# Patient Record
Sex: Female | Born: 1955 | Race: White | Hispanic: No | Marital: Single | Smoking: Never smoker
Health system: Southern US, Community
[De-identification: ages and names within clinical notes are randomized; demographics above are authoritative.]

## PROBLEM LIST (undated history)

## (undated) DIAGNOSIS — E785 Hyperlipidemia, unspecified: Secondary | ICD-10-CM

## (undated) DIAGNOSIS — R399 Unspecified symptoms and signs involving the genitourinary system: Secondary | ICD-10-CM

## (undated) DIAGNOSIS — Z8669 Personal history of other diseases of the nervous system and sense organs: Secondary | ICD-10-CM

## (undated) DIAGNOSIS — M8580 Other specified disorders of bone density and structure, unspecified site: Secondary | ICD-10-CM

## (undated) DIAGNOSIS — H3530 Unspecified macular degeneration: Secondary | ICD-10-CM

## (undated) HISTORY — DX: Unspecified macular degeneration: H35.30

## (undated) HISTORY — DX: Other specified disorders of bone density and structure, unspecified site: M85.80

## (undated) HISTORY — DX: Unspecified symptoms and signs involving the genitourinary system: R39.9

## (undated) HISTORY — DX: Hyperlipidemia, unspecified: E78.5

## (undated) HISTORY — PX: BREAST ENHANCEMENT SURGERY: SHX7

## (undated) HISTORY — DX: Personal history of other diseases of the nervous system and sense organs: Z86.69

---

## 1998-03-21 ENCOUNTER — Encounter: Payer: Self-pay | Admitting: Internal Medicine

## 1999-07-21 ENCOUNTER — Other Ambulatory Visit: Admission: RE | Admit: 1999-07-21 | Discharge: 1999-07-21 | Payer: Self-pay | Admitting: Internal Medicine

## 2000-10-28 ENCOUNTER — Other Ambulatory Visit: Admission: RE | Admit: 2000-10-28 | Discharge: 2000-10-28 | Payer: Self-pay | Admitting: Internal Medicine

## 2002-09-12 ENCOUNTER — Ambulatory Visit (HOSPITAL_COMMUNITY): Admission: RE | Admit: 2002-09-12 | Discharge: 2002-09-12 | Payer: Self-pay | Admitting: Internal Medicine

## 2002-09-12 ENCOUNTER — Encounter: Payer: Self-pay | Admitting: Internal Medicine

## 2002-11-13 ENCOUNTER — Other Ambulatory Visit: Admission: RE | Admit: 2002-11-13 | Discharge: 2002-11-13 | Payer: Self-pay | Admitting: Internal Medicine

## 2002-11-21 HISTORY — PX: OTHER SURGICAL HISTORY: SHX169

## 2002-12-04 ENCOUNTER — Encounter: Admission: RE | Admit: 2002-12-04 | Discharge: 2002-12-04 | Payer: Self-pay | Admitting: Internal Medicine

## 2002-12-04 ENCOUNTER — Encounter: Payer: Self-pay | Admitting: Internal Medicine

## 2004-02-21 ENCOUNTER — Encounter: Payer: Self-pay | Admitting: Internal Medicine

## 2004-02-21 LAB — CONVERTED CEMR LAB

## 2004-03-19 ENCOUNTER — Other Ambulatory Visit: Admission: RE | Admit: 2004-03-19 | Discharge: 2004-03-19 | Payer: Self-pay | Admitting: Internal Medicine

## 2004-12-10 ENCOUNTER — Ambulatory Visit: Payer: Self-pay | Admitting: Internal Medicine

## 2005-04-06 ENCOUNTER — Ambulatory Visit: Payer: Self-pay | Admitting: Internal Medicine

## 2006-02-23 ENCOUNTER — Ambulatory Visit: Payer: Self-pay | Admitting: Internal Medicine

## 2006-02-23 ENCOUNTER — Other Ambulatory Visit: Admission: RE | Admit: 2006-02-23 | Discharge: 2006-02-23 | Payer: Self-pay | Admitting: Internal Medicine

## 2006-03-17 ENCOUNTER — Ambulatory Visit: Payer: Self-pay | Admitting: Internal Medicine

## 2006-03-18 ENCOUNTER — Encounter: Admission: RE | Admit: 2006-03-18 | Discharge: 2006-03-18 | Payer: Self-pay | Admitting: Internal Medicine

## 2006-04-14 ENCOUNTER — Ambulatory Visit: Payer: Self-pay | Admitting: Internal Medicine

## 2007-02-17 ENCOUNTER — Encounter: Payer: Self-pay | Admitting: Internal Medicine

## 2007-02-17 DIAGNOSIS — M949 Disorder of cartilage, unspecified: Secondary | ICD-10-CM

## 2007-02-17 DIAGNOSIS — M899 Disorder of bone, unspecified: Secondary | ICD-10-CM | POA: Insufficient documentation

## 2007-03-02 ENCOUNTER — Telehealth: Payer: Self-pay | Admitting: Internal Medicine

## 2007-03-08 ENCOUNTER — Encounter: Payer: Self-pay | Admitting: Internal Medicine

## 2007-03-14 ENCOUNTER — Ambulatory Visit: Payer: Self-pay | Admitting: Gastroenterology

## 2007-03-16 ENCOUNTER — Ambulatory Visit: Payer: Self-pay | Admitting: Internal Medicine

## 2007-03-16 ENCOUNTER — Encounter: Payer: Self-pay | Admitting: Internal Medicine

## 2007-03-24 ENCOUNTER — Ambulatory Visit: Payer: Self-pay | Admitting: Gastroenterology

## 2007-03-24 ENCOUNTER — Encounter: Payer: Self-pay | Admitting: Internal Medicine

## 2007-04-01 ENCOUNTER — Ambulatory Visit: Payer: Self-pay | Admitting: Internal Medicine

## 2007-04-01 LAB — CONVERTED CEMR LAB
Bilirubin Urine: NEGATIVE
Blood in Urine, dipstick: NEGATIVE
Glucose, Urine, Semiquant: NEGATIVE
Ketones, urine, test strip: NEGATIVE
Nitrite: NEGATIVE
Protein, U semiquant: NEGATIVE
Specific Gravity, Urine: 1.015
Urobilinogen, UA: 0.2
pH: 6

## 2007-04-03 LAB — CONVERTED CEMR LAB
ALT: 14 units/L (ref 0–35)
AST: 20 units/L (ref 0–37)
Albumin: 4.1 g/dL (ref 3.5–5.2)
Alkaline Phosphatase: 60 units/L (ref 39–117)
BUN: 23 mg/dL (ref 6–23)
Basophils Absolute: 0 10*3/uL (ref 0.0–0.1)
Basophils Relative: 0.4 % (ref 0.0–1.0)
Bilirubin, Direct: 0.1 mg/dL (ref 0.0–0.3)
CO2: 31 meq/L (ref 19–32)
Calcium: 9.4 mg/dL (ref 8.4–10.5)
Chloride: 107 meq/L (ref 96–112)
Cholesterol: 235 mg/dL (ref 0–200)
Creatinine, Ser: 0.8 mg/dL (ref 0.4–1.2)
Direct LDL: 144.4 mg/dL
Eosinophils Absolute: 0.2 10*3/uL (ref 0.0–0.6)
Eosinophils Relative: 4.4 % (ref 0.0–5.0)
GFR calc Af Amer: 96 mL/min
GFR calc non Af Amer: 80 mL/min
Glucose, Bld: 80 mg/dL (ref 70–99)
HCT: 36.6 % (ref 36.0–46.0)
HDL: 73.2 mg/dL (ref >39.0)
Hemoglobin: 12.7 g/dL (ref 12.0–15.0)
Lymphocytes Relative: 42.2 % (ref 12.0–46.0)
MCHC: 34.7 g/dL (ref 30.0–36.0)
MCV: 92.4 fL (ref 78.0–100.0)
Monocytes Absolute: 0.5 10*3/uL (ref 0.2–0.7)
Monocytes Relative: 10.5 % (ref 3.0–11.0)
Neutro Abs: 1.9 10*3/uL (ref 1.4–7.7)
Neutrophils Relative %: 42.5 % — ABNORMAL LOW (ref 43.0–77.0)
Platelets: 213 10*3/uL (ref 150–400)
Potassium: 4.4 meq/L (ref 3.5–5.1)
RBC: 3.96 M/uL (ref 3.87–5.11)
RDW: 12 % (ref 11.5–14.6)
Sodium: 142 meq/L (ref 135–145)
TSH: 1.57 microintl units/mL (ref 0.35–5.50)
Total Bilirubin: 0.6 mg/dL (ref 0.3–1.2)
Total CHOL/HDL Ratio: 3.2
Total Protein: 6.4 g/dL (ref 6.0–8.3)
Triglycerides: 43 mg/dL (ref 0–149)
VLDL: 9 mg/dL (ref 0–40)
WBC: 4.4 10*3/uL — ABNORMAL LOW (ref 4.5–10.5)

## 2007-04-11 ENCOUNTER — Other Ambulatory Visit: Admission: RE | Admit: 2007-04-11 | Discharge: 2007-04-11 | Payer: Self-pay | Admitting: Internal Medicine

## 2007-04-11 ENCOUNTER — Ambulatory Visit: Payer: Self-pay | Admitting: Internal Medicine

## 2007-04-11 ENCOUNTER — Encounter: Payer: Self-pay | Admitting: Internal Medicine

## 2007-04-11 DIAGNOSIS — Z8739 Personal history of other diseases of the musculoskeletal system and connective tissue: Secondary | ICD-10-CM | POA: Insufficient documentation

## 2007-04-11 DIAGNOSIS — H3530 Unspecified macular degeneration: Secondary | ICD-10-CM | POA: Insufficient documentation

## 2007-04-11 LAB — HM COLONOSCOPY: HM Colonoscopy: NORMAL

## 2007-04-11 LAB — HM MAMMOGRAPHY

## 2007-12-26 ENCOUNTER — Telehealth: Payer: Self-pay | Admitting: *Deleted

## 2008-02-13 ENCOUNTER — Telehealth: Payer: Self-pay | Admitting: Internal Medicine

## 2008-03-12 ENCOUNTER — Encounter: Payer: Self-pay | Admitting: Internal Medicine

## 2008-03-19 ENCOUNTER — Ambulatory Visit: Payer: Self-pay | Admitting: Internal Medicine

## 2008-03-19 DIAGNOSIS — G479 Sleep disorder, unspecified: Secondary | ICD-10-CM | POA: Insufficient documentation

## 2008-03-19 DIAGNOSIS — M79609 Pain in unspecified limb: Secondary | ICD-10-CM | POA: Insufficient documentation

## 2008-03-19 DIAGNOSIS — M25519 Pain in unspecified shoulder: Secondary | ICD-10-CM | POA: Insufficient documentation

## 2008-03-19 DIAGNOSIS — H1045 Other chronic allergic conjunctivitis: Secondary | ICD-10-CM | POA: Insufficient documentation

## 2008-03-19 DIAGNOSIS — E785 Hyperlipidemia, unspecified: Secondary | ICD-10-CM | POA: Insufficient documentation

## 2008-03-23 ENCOUNTER — Telehealth (INDEPENDENT_AMBULATORY_CARE_PROVIDER_SITE_OTHER): Payer: Self-pay | Admitting: *Deleted

## 2008-03-27 ENCOUNTER — Encounter: Payer: Self-pay | Admitting: Internal Medicine

## 2008-04-02 ENCOUNTER — Telehealth: Payer: Self-pay | Admitting: *Deleted

## 2008-04-04 ENCOUNTER — Encounter: Payer: Self-pay | Admitting: Internal Medicine

## 2008-04-11 ENCOUNTER — Ambulatory Visit: Payer: Self-pay | Admitting: Internal Medicine

## 2008-04-11 LAB — CONVERTED CEMR LAB
ALT: 15 units/L (ref 0–35)
AST: 16 units/L (ref 0–37)
Albumin: 4.1 g/dL (ref 3.5–5.2)
Alkaline Phosphatase: 72 units/L (ref 39–117)
BUN: 15 mg/dL (ref 6–23)
Basophils Absolute: 0 10*3/uL (ref 0.0–0.1)
Basophils Relative: 0.2 % (ref 0.0–3.0)
Bilirubin Urine: NEGATIVE
Bilirubin, Direct: 0.1 mg/dL (ref 0.0–0.3)
Blood in Urine, dipstick: NEGATIVE
CO2: 34 meq/L — ABNORMAL HIGH (ref 19–32)
Calcium: 9.5 mg/dL (ref 8.4–10.5)
Chloride: 101 meq/L (ref 96–112)
Cholesterol: 247 mg/dL (ref 0–200)
Creatinine, Ser: 0.8 mg/dL (ref 0.4–1.2)
Direct LDL: 125.9 mg/dL
Eosinophils Absolute: 0.1 10*3/uL (ref 0.0–0.7)
Eosinophils Relative: 2.8 % (ref 0.0–5.0)
GFR calc Af Amer: 96 mL/min
GFR calc non Af Amer: 79 mL/min
Glucose, Bld: 75 mg/dL (ref 70–99)
Glucose, Urine, Semiquant: NEGATIVE
HCT: 38.6 % (ref 36.0–46.0)
HDL: 77.6 mg/dL (ref >39.0)
Hemoglobin: 13.3 g/dL (ref 12.0–15.0)
Ketones, urine, test strip: NEGATIVE
Lymphocytes Relative: 50 % — ABNORMAL HIGH (ref 12.0–46.0)
MCHC: 34.5 g/dL (ref 30.0–36.0)
MCV: 94.6 fL (ref 78.0–100.0)
Monocytes Absolute: 0.6 10*3/uL (ref 0.1–1.0)
Monocytes Relative: 12 % (ref 3.0–12.0)
Neutro Abs: 1.8 10*3/uL (ref 1.4–7.7)
Neutrophils Relative %: 35 % — ABNORMAL LOW (ref 43.0–77.0)
Nitrite: NEGATIVE
Platelets: 194 10*3/uL (ref 150–400)
Potassium: 3.9 meq/L (ref 3.5–5.1)
Protein, U semiquant: NEGATIVE
RBC: 4.08 M/uL (ref 3.87–5.11)
RDW: 12.8 % (ref 11.5–14.6)
Sodium: 143 meq/L (ref 135–145)
Specific Gravity, Urine: 1.02
TSH: 3.26 microintl units/mL (ref 0.35–5.50)
Total Bilirubin: 0.8 mg/dL (ref 0.3–1.2)
Total CHOL/HDL Ratio: 3.2
Total Protein: 6.9 g/dL (ref 6.0–8.3)
Triglycerides: 80 mg/dL (ref 0–149)
Urobilinogen, UA: 0.2
VLDL: 16 mg/dL (ref 0–40)
WBC: 5.1 10*3/uL (ref 4.5–10.5)
pH: 5.5

## 2008-04-27 ENCOUNTER — Ambulatory Visit: Payer: Self-pay | Admitting: Internal Medicine

## 2008-04-27 DIAGNOSIS — L608 Other nail disorders: Secondary | ICD-10-CM | POA: Insufficient documentation

## 2008-08-25 IMAGING — CR DG CHEST 2V
2 series · 2 of 2 positions shown · non-contrast
Comparison: none

CLINICAL DATA: Cough.  Chest pain. 
 CHEST ? 2 VIEW:

[view not recorded (1 of 2)]
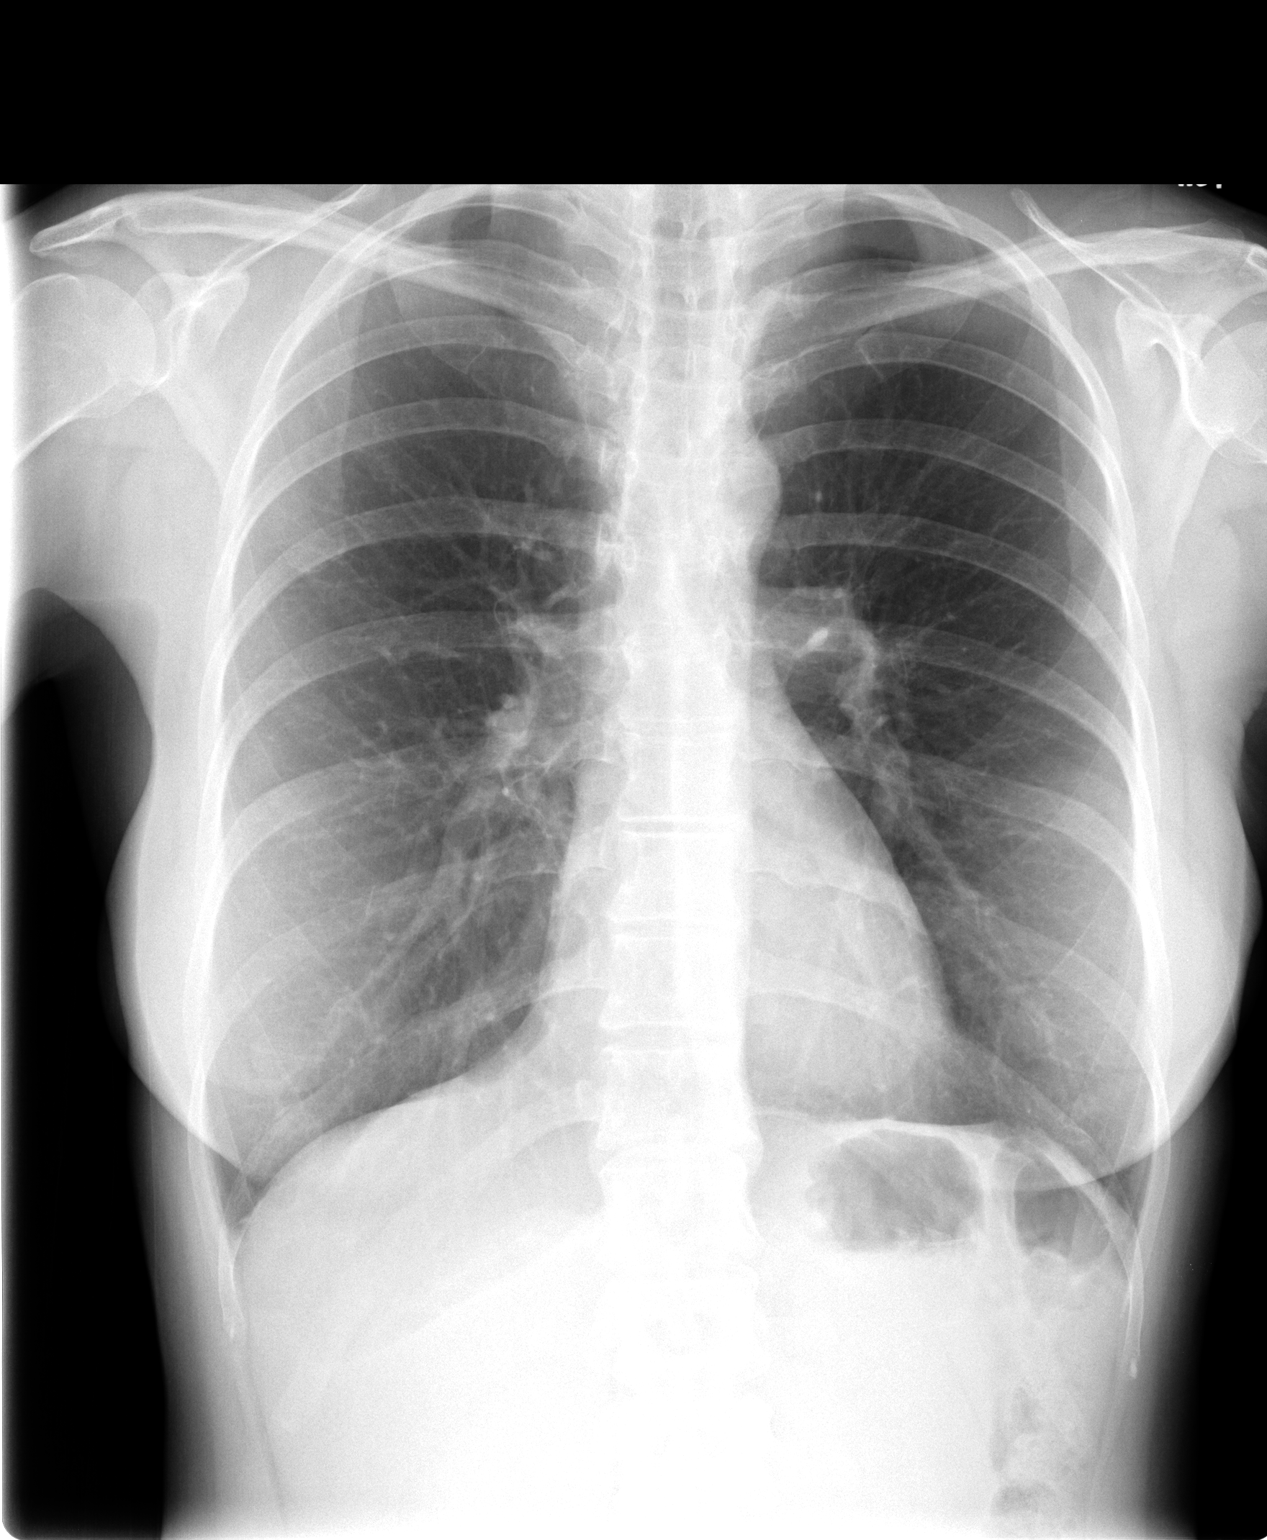

[view not recorded (2 of 2)]
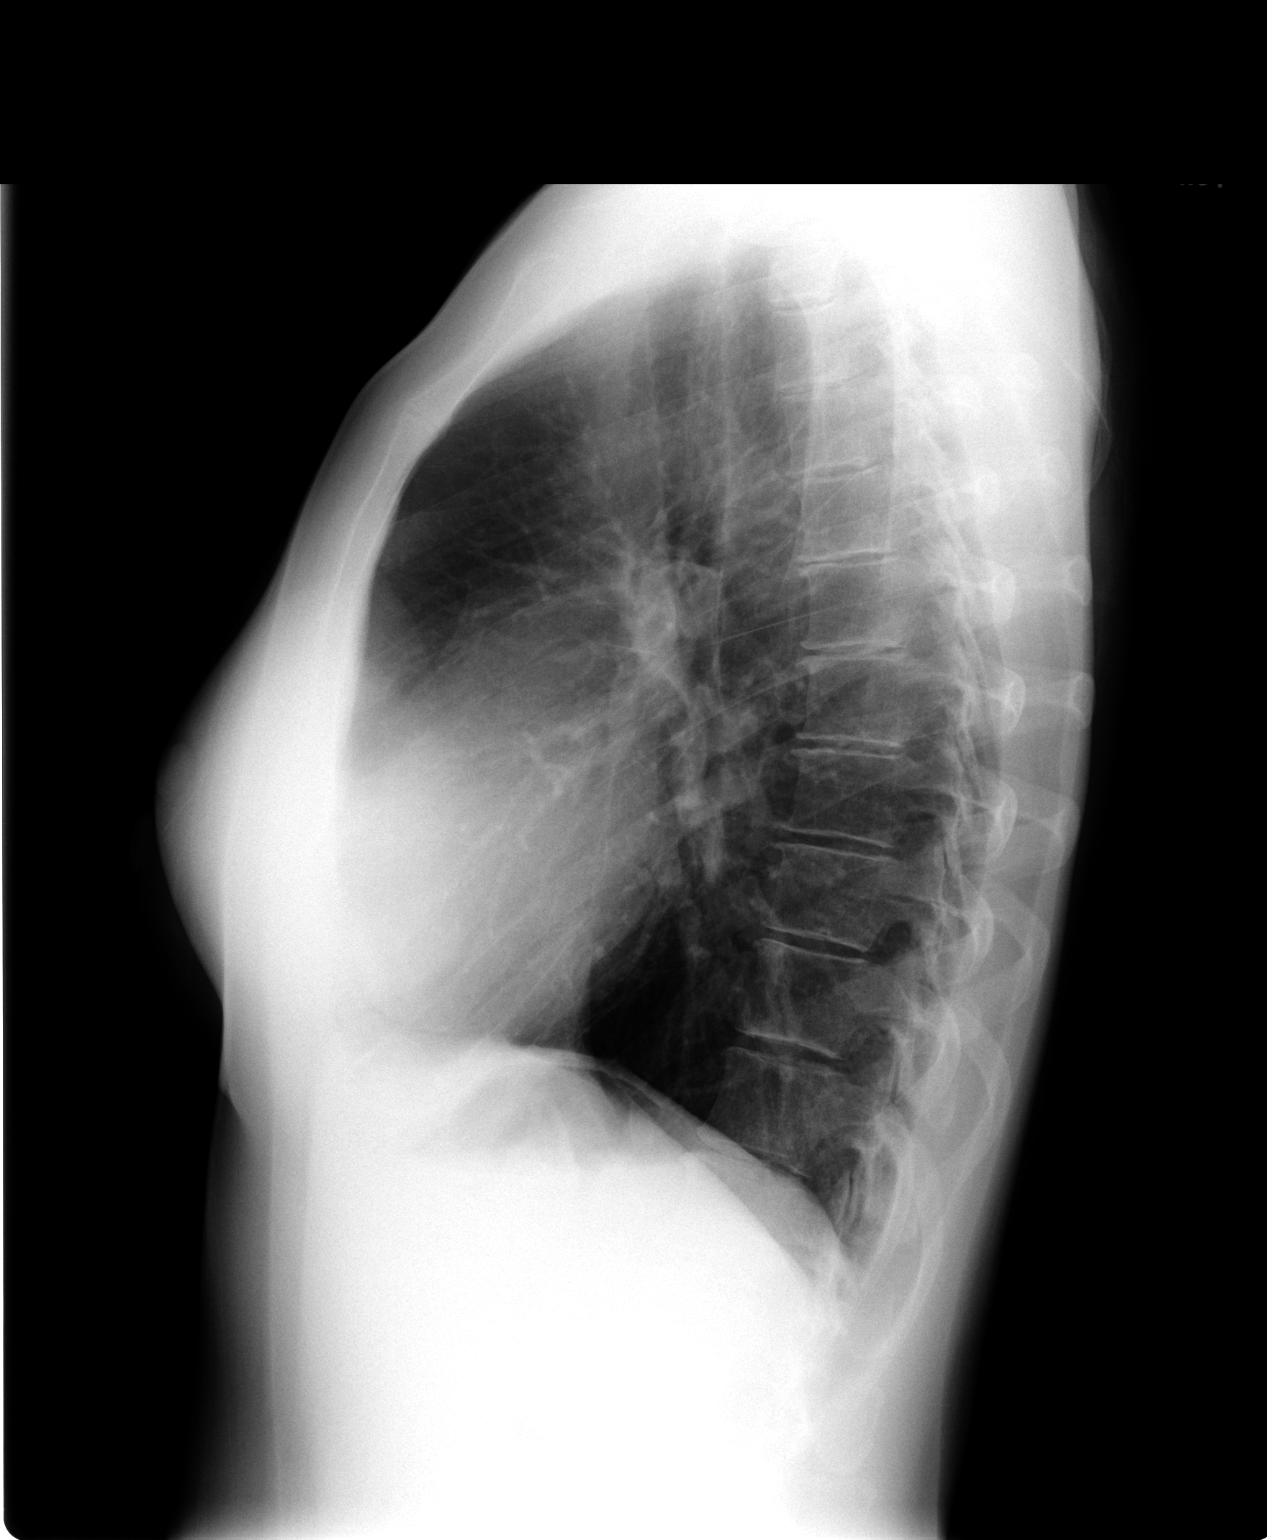

[2 of 2 positions shown; findings below may reference images not displayed]

FINDINGS: Two views of the chest show the lungs to be clear.  The heart is within normal limits in size.  No bony abnormality is seen.
IMPRESSION: No active lung disease.

## 2008-11-15 ENCOUNTER — Ambulatory Visit: Payer: Self-pay | Admitting: Internal Medicine

## 2008-11-15 DIAGNOSIS — J019 Acute sinusitis, unspecified: Secondary | ICD-10-CM | POA: Insufficient documentation

## 2008-11-15 DIAGNOSIS — J309 Allergic rhinitis, unspecified: Secondary | ICD-10-CM | POA: Insufficient documentation

## 2009-03-25 ENCOUNTER — Encounter: Payer: Self-pay | Admitting: Internal Medicine

## 2009-03-26 ENCOUNTER — Telehealth: Payer: Self-pay | Admitting: *Deleted

## 2009-03-27 ENCOUNTER — Ambulatory Visit: Payer: Self-pay | Admitting: Internal Medicine

## 2009-04-29 ENCOUNTER — Encounter: Payer: Self-pay | Admitting: Internal Medicine

## 2009-04-29 ENCOUNTER — Ambulatory Visit: Payer: Self-pay | Admitting: Internal Medicine

## 2009-05-01 ENCOUNTER — Ambulatory Visit: Payer: Self-pay | Admitting: Internal Medicine

## 2009-05-01 LAB — CONVERTED CEMR LAB
ALT: 15 units/L (ref 0–35)
AST: 21 units/L (ref 0–37)
Alkaline Phosphatase: 67 units/L (ref 39–117)
Basophils Relative: 0.7 % (ref 0.0–3.0)
Bilirubin, Direct: 0 mg/dL (ref 0.0–0.3)
Chloride: 104 meq/L (ref 96–112)
Cholesterol: 212 mg/dL — ABNORMAL HIGH (ref 0–200)
Creatinine, Ser: 0.7 mg/dL (ref 0.4–1.2)
Eosinophils Relative: 2.6 % (ref 0.0–5.0)
Lymphocytes Relative: 41.6 % (ref 12.0–46.0)
MCV: 94.6 fL (ref 78.0–100.0)
Monocytes Absolute: 0.4 10*3/uL (ref 0.1–1.0)
Monocytes Relative: 11.1 % (ref 3.0–12.0)
Neutrophils Relative %: 44 % (ref 43.0–77.0)
Protein, U semiquant: NEGATIVE
RBC: 3.82 M/uL — ABNORMAL LOW (ref 3.87–5.11)
Total CHOL/HDL Ratio: 3
Total Protein: 6.8 g/dL (ref 6.0–8.3)
Urobilinogen, UA: 0.2
VLDL: 11.2 mg/dL (ref 0.0–40.0)
WBC: 3.6 10*3/uL — ABNORMAL LOW (ref 4.5–10.5)

## 2009-05-08 ENCOUNTER — Other Ambulatory Visit: Admission: RE | Admit: 2009-05-08 | Discharge: 2009-05-08 | Payer: Self-pay | Admitting: Internal Medicine

## 2009-05-08 ENCOUNTER — Ambulatory Visit: Payer: Self-pay | Admitting: Internal Medicine

## 2009-06-04 ENCOUNTER — Telehealth: Payer: Self-pay | Admitting: Internal Medicine

## 2009-09-06 ENCOUNTER — Telehealth: Payer: Self-pay | Admitting: *Deleted

## 2010-03-04 ENCOUNTER — Ambulatory Visit: Payer: Self-pay | Admitting: Internal Medicine

## 2010-03-27 ENCOUNTER — Encounter: Payer: Self-pay | Admitting: Internal Medicine

## 2010-03-31 ENCOUNTER — Encounter: Payer: Self-pay | Admitting: Internal Medicine

## 2010-04-01 ENCOUNTER — Encounter: Payer: Self-pay | Admitting: Internal Medicine

## 2010-04-03 ENCOUNTER — Telehealth: Payer: Self-pay | Admitting: Internal Medicine

## 2010-04-07 ENCOUNTER — Telehealth: Payer: Self-pay | Admitting: Internal Medicine

## 2010-05-20 ENCOUNTER — Ambulatory Visit: Payer: Self-pay | Admitting: Internal Medicine

## 2010-05-20 LAB — CONVERTED CEMR LAB
AST: 21 units/L (ref 0–37)
Albumin: 4.2 g/dL (ref 3.5–5.2)
Alkaline Phosphatase: 69 units/L (ref 39–117)
Basophils Absolute: 0 10*3/uL (ref 0.0–0.1)
Basophils Relative: 0.5 % (ref 0.0–3.0)
Bilirubin, Direct: 0.1 mg/dL (ref 0.0–0.3)
Calcium: 9.4 mg/dL (ref 8.4–10.5)
Creatinine, Ser: 0.8 mg/dL (ref 0.4–1.2)
GFR calc non Af Amer: 84.86 mL/min (ref >60)
HDL: 82 mg/dL (ref >39.00)
Hemoglobin: 12.8 g/dL (ref 12.0–15.0)
Ketones, urine, test strip: NEGATIVE
Lymphocytes Relative: 37.2 % (ref 12.0–46.0)
Monocytes Relative: 10.6 % (ref 3.0–12.0)
Neutro Abs: 2.2 10*3/uL (ref 1.4–7.7)
Neutrophils Relative %: 49.1 % (ref 43.0–77.0)
Nitrite: NEGATIVE
Protein, U semiquant: NEGATIVE
RBC: 3.93 M/uL (ref 3.87–5.11)
RDW: 13.9 % (ref 11.5–14.6)
Sodium: 141 meq/L (ref 135–145)
Total CHOL/HDL Ratio: 3
Triglycerides: 43 mg/dL (ref 0.0–149.0)
Urobilinogen, UA: 0.2

## 2010-05-27 ENCOUNTER — Ambulatory Visit: Payer: Self-pay | Admitting: Internal Medicine

## 2010-07-22 NOTE — Progress Notes (Signed)
Summary: Paula Salas please call  Phone Note Call from Patient Call back at Home Phone [phone]   Caller: Patient Call For: [name] MD Summary of Call: pt would like to speak with Paula Salas today. Initial call taken by: Heron Sabins,  April 03, 2010 9:12 AM  Follow-up for Phone Call        LM for pt to call back. Follow-up by: Romualdo Bolk, CMA Duncan Dull),  April 03, 2010 9:20 AM  Additional Follow-up for Phone Call Additional follow up Details #1::        Spoke to pt- Pt is going on a trip next week and wants a flu shot.  She keeps having coughing in her chest, sinus drainage. Pt is taking sudafed during the day. Pt states that she is worried about her inj's that she Dr.Rankin and she is afraid that this maybe a side effect.  I told pt to call Dr. Luciana Axe about this. Continue the sudafed or delsym.  Pt to call Dr. Ephriam Knuckles office to see if this could be a side effect of the medication and to see if she can get a flu shot because her next inj is next week which is when she is wanting to get the flu shot. Pt to call us back if she starts running a fever or gets worse. She will also let us know about what Dr. Luciana Axe said about the flu shot as well. Additional Follow-up by: Romualdo Bolk, CMA (AAMA),  April 03, 2010 9:30 AM    Additional Follow-up for Phone Call Additional follow up Details #2::    can get a flu shot any time not having a fever illness Follow-up by: [name] MD,  April 03, 2010 12:56 PM   Appended Document: Paula Salas please call Pt aware of this.

## 2010-07-22 NOTE — Assessment & Plan Note (Signed)
Summary: cpx/njr   Vital Signs:  Patient profile:   55 year old female Menstrual status:  postmenopausal Height:      62.25 inches Weight:      136 pounds Pulse rate:   66 / minute BP sitting:   100 / 60  (left arm) Cuff size:   regular  Vitals Entered By: Romualdo Bolk, CMA (AAMA) (May 27, 2010 11:21 AM) CC: CPX discuss doing a pap   History of Present Illness: Paula Salas comes in today  for preventive visit . Since her last visit she is doing well No major changes in health status.   Eye injections weekly and stable MS  Back and hip stable and walks well.  No injury . UTD on mammogram . traveling back anc forth [place]    Preventive Care Screening  Mammogram:    Date:  03/22/2010    Results:  normal   Prior Values:    Pap Smear:  NEGATIVE FOR INTRAEPITHELIAL LESIONS OR MALIGNANCY. (05/08/2009)    Mammogram:  normal (03/25/2009)    Colonoscopy:  normal (04/11/2007)    Last Tetanus Booster:  Tdap (05/08/2009)   Preventive Screening-Counseling & Management  Alcohol-Tobacco     Alcohol drinks/day: 0     Smoking Status: never     Passive Smoke Exposure: no  Caffeine-Diet-Exercise     Caffeine use/day: 2     Does Patient Exercise: yes  Hep-HIV-STD-Contraception     Dental Visit-last 6 months yes     Sun Exposure-Excessive: no  Safety-Violence-Falls     Seat Belt Use: yes     Firearms in the Home: no firearms in the home     Smoke Detectors: yes     Fall Risk: non  Current Medications (verified): 1)  Calcium-Vitamin D 600-200 Mg-Unit  Tabs (Calcium-Vitamin D) .... Once Daily 2)  Zovirax 5 %  Crea (Acyclovir) .... Use As Directed 3)  Nasonex 50 Mcg/act Susp (Mometasone Furoate) .... 2 Spray Each Nares To Control Allergies 4)  Icaps  Caps (Multiple Vitamins-Minerals) 5)  Magnesium Oxide 250 Mg Tabs (Magnesium Oxide) 6)  Vitamin B Complex-C   Caps (B Complex-C) 7)  Valtrex 1 Gm Tabs (Valacyclovir Hcl) .... 2 By Mouth Two Times A Day 8)   Systane Preservative Free 0.4-0.3 % Soln (Polyethyl Glycol-Propyl Glycol)  Allergies (verified): No Known Drug Allergies  Past History:  Past medical, surgical, family and social histories (including risk factors) reviewed, and no changes noted (except as noted below).  Past Medical History: Reviewed history from 05/08/2009 and no changes required. Osteopenia dexa 98 -1.4 and,-1.1, 99 -1.77 and -1.9, 2010 -1.9 -1.6 on bisphosphonate from 12 2003 to 4/07 Macular degeneration  hx of retinal hemorrhage Hyperlipidemia         LAST Mammogram: 2009 Pap: 2008 Td: 2000 Colonscopy:2001 EKG: 2004 Dexa: 2009 Allergic rhinitis  Past Surgical History: Reviewed history from 02/17/2007 and no changes required. breast implants colonoscopy 2007 MRI hip negative 06/04  Past History:  Care Management: Orthopedics: Tomasita Crumble Ophthalmology: [facility]in   Family History: Reviewed history from 11/15/2008 and no changes required. Family History of Colon CA 1st degree relative <60 Osteoporosis      Social History: Reviewed history from 03/04/2010 and no changes required. Single Never Smoked Alcohol use-no back to [facility]  for the winter   BF in [place] [facility] of [facility] no pets.      Fall Risk:  non  Review of Systems  12 sytem review neg cv pulm  GI GU .  decrease vision with MAc degeneration getting her shots .  feels well.  gets sinsu infection with weather changes   Physical Exam  General:  Well-developed,well-nourished,in no acute distress; alert,appropriate and cooperative throughout examination Head:  Normocephalic and atraumatic without obvious abnormalities. No apparent alopecia or balding. Eyes:  vision grossly intact.   Ears:  R ear normal and L ear normal.   Nose:  no external deformity, no external erythema, and no nasal discharge.   Mouth:  good dentition and pharynx pink and moist.   Neck:  No deformities, masses, or tenderness noted. Breasts:  No mass, nodules,  thickening, tenderness, bulging, retraction, inflamation, nipple discharge or skin changes noted.   breast  implants  Lungs:  Normal respiratory effort, chest expands symmetrically. Lungs are clear to auscultation, no crackles or wheezes.no dullness.   Heart:  Normal rate and regular rhythm. S1 and S2 normal without gallop, murmur, click, rub or other extra sounds.no lifts.   Abdomen:  Bowel sounds positive,abdomen soft and non-tender without masses, organomegaly or hernias noted. Genitalia:  no indecated  Msk:  no joint warmth, no redness over joints, and no joint deformities.   Pulses:  pulses intact without delay   Extremities:  no clubbing cyanosis or edema  Neurologic:  alert & oriented X3, cranial nerves IIi-XII intact, strength normal in all extremities, and gait normal.   Skin:  turgor normal, color normal, no ecchymoses, no petechiae, and no purpura.   Cervical Nodes:  No lymphadenopathy noted Axillary Nodes:  No palpable lymphadenopathy Inguinal Nodes:  No significant adenopathy Psych:  Normal eye contact, appropriate affect. Cognition appears normal.    Impression & Recommendations:  Problem # 1:  Preventive Health Care (ICD-V70.0) contniue health lifestyle   flu shot today  Problem # 2:  HYPERLIPIDEMIA (ICD-272.4)  good ratio   follow could get ldl down  Labs Reviewed: SGOT: 21 (05/20/2010)   SGPT: 15 (05/20/2010)   HDL:82.00 (05/20/2010), 63.10 (05/01/2009)  LDL:DEL (04/11/2008), DEL (04/01/2007)  Chol:259 (05/20/2010), 212 (05/01/2009)  Trig:43.0 (05/20/2010), 56.0 (05/01/2009)  Problem # 3:  OSTEOPENIA (ICD-733.90) continue  last dexa 2010  Her updated medication list for this problem includes:    Calcium-vitamin D 600-200 Mg-unit Tabs (Calcium-vitamin d) ..... 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  Complete Medication List: 1)  Calcium-vitamin D 600-200 Mg-unit Tabs (Calcium-vitamin d) .... Once daily 2)  Zovirax 5 % Crea (Acyclovir) .... Use as directed 3)  Nasonex 50 Mcg/act Susp (Mometasone furoate) .... 2 spray each nares to control allergies 4)  Icaps Caps (Multiple vitamins-minerals) 5)  Magnesium Oxide 250 Mg Tabs (Magnesium oxide) 6)  Vitamin B Complex-c Caps (B complex-c) 7)  Valtrex 1 Gm Tabs (Valacyclovir hcl) .... 2 by mouth two times a day 8)  Systane Preservative Free 0.4-0.3 % Soln (Polyethyl glycol-propyl glycol)  Other Orders: Admin 1st Vaccine ([address]) Flu Vaccine 52yrs + (60454)  Patient Instructions: 1)  continue heart healthy diet .  2)  exercise . 3)  Check up in a year or as needed. 4)  Can do PAPs every 2-3 years    Orders Added: 1)  Admin 1st Vaccine [90471] 2)  Flu Vaccine 40yrs + [90658] 3)  Est. Patient 40-64 years [99396]    Preventive Care  Screening  Mammogram:    Date:  03/22/2010    Results:  normal   Prior Values:    Pap Smear:  NEGATIVE FOR INTRAEPITHELIAL LESIONS OR MALIGNANCY. (05/08/2009)    Mammogram:  normal (03/25/2009)    Colonoscopy:  normal (04/11/2007)    Last Tetanus Booster:  Tdap (05/08/2009) Flu Vaccine Consent Questions     Do you have a history of severe allergic reactions to this vaccine? no    Any prior history of allergic reactions to egg and/or gelatin? no    Do you have a sensitivity to the preservative Thimersol? no    Do you have a past history of Guillan-Barre Syndrome? no    Do you currently have an acute febrile illness? no    Have you ever had a severe reaction to latex? no    Vaccine information given and explained to patient? yes    Are you currently pregnant? no    Lot Number:[id number]   Exp Date:12/20/2010   Site Given  Left Deltoid IM Romualdo Bolk, CMA (AAMA)  May 27, 2010 12:20 PM     Mammogram:  normal (03/25/2009)    Colonoscopy:  normal (04/11/2007)    Last  Tetanus Booster:  Tdap (05/08/2009)        .lbflu

## 2010-07-22 NOTE — Assessment & Plan Note (Signed)
Summary: sinus/dm   Vital Signs:  Patient profile:   55 year old female Menstrual status:  postmenopausal Height:      62.25 inches Weight:      134 pounds BMI:     24.40 Temp:     98.8 degrees F oral Pulse rate:   84 / minute BP sitting:   120 / 80  (right arm) Cuff size:   regular  Vitals Entered By: Romualdo Bolk, CMA (AAMA) (March 04, 2010 9:41 AM) CC: Ha, sinus congestion, gum pain x 1 week ago. Pt states that it has now gone down into her chest with chills at night, eyes watery and nasal congestion. Pt has taken zyrtec but it made her nose dry and took allerga but couldn't sleep at night.    History of Present Illness: Paula Salas comes in today  for above    worse  after a week and worried about bronchitis . also.   Had teeth pain at onset and now some better but ongoing .  NO fever .  chills at night and  achy  .  had tried zyrtec and dired her out. ? if allergy or not  . Trying   Mucinex d  nasal decongestant      and netti pot.    bad congestion  at night and used nose spray.  No c sob NVD    Preventive Screening-Counseling & Management  Alcohol-Tobacco     Alcohol drinks/day: 0     Smoking Status: never     Passive Smoke Exposure: no  Caffeine-Diet-Exercise     Caffeine use/day: 2     Does Patient Exercise: yes  Current Medications (verified): 1)  Calcium-Vitamin D 600-200 Mg-Unit  Tabs (Calcium-Vitamin D) .... Once Daily 2)  Multiple Vitamin   Tabs (Multiple Vitamin) 3)  Zovirax 5 %  Crea (Acyclovir) .... Use As Directed 4)  Trazodone Hcl 50 Mg Tabs (Trazodone Hcl) .... 1/2 To 1 By Mouth Hs As Needed Sleep 5)  Nasonex 50 Mcg/act Susp (Mometasone Furoate) .... 2 Spray Each Nares To Control Allergies 6)  Icaps  Caps (Multiple Vitamins-Minerals) 7)  Magnesium Oxide 250 Mg Tabs (Magnesium Oxide) 8)  Vitamin B Complex-C   Caps (B Complex-C) 9)  Valtrex 1 Gm Tabs (Valacyclovir Hcl) .... 2 By Mouth Two Times A Day 10)  Systane Preservative Free  0.4-0.3 % Soln (Polyethyl Glycol-Propyl Glycol)  Allergies (verified): No Known Drug Allergies  Past History:  Past medical, surgical, family and social histories (including risk factors) reviewed, and no changes noted (except as noted below).  Past Medical History: Reviewed history from 05/08/2009 and no changes required. Osteopenia dexa 98 -1.4 and,-1.1, 99 -1.77 and -1.9, 2010 -1.9 -1.6 on bisphosphonate from 12 2003 to 4/07 Macular degeneration  hx of retinal hemorrhage Hyperlipidemia         LAST Mammogram: 2009 Pap: 2008 Td: 2000 Colonscopy:2001 EKG: 2004 Dexa: 2009 Allergic rhinitis  Past Surgical History: Reviewed history from 02/17/2007 and no changes required. breast implants colonoscopy 2007 MRI hip negative 06/04  Past History:  Care Management: Orthopedics: Tomasita Crumble Ophthalmology: Rankin   Family History: Reviewed history from 11/15/2008 and no changes required. Family History of Colon CA 1st degree relative <60 Osteoporosis      Social History: Reviewed history from 05/08/2009 and no changes required. Single Never Smoked Alcohol use-no back to [facility]  for the winter  [facility] of 1 no pets.       Review of Systems  The patient complains of anorexia and headaches.  The patient denies decreased hearing, hoarseness, hemoptysis, abdominal pain, muscle weakness, abnormal bleeding, enlarged lymph nodes, and angioedema.         chills at night and sweats   Physical Exam  General:  Well-developed,well-nourished,in no acute distress; alert,appropriate and cooperative throughout examination  congested and tired appearign Head:  normocephalic and atraumatic.   Eyes:  PERRL, EOMs full, conjunctiva clear  Ears:  R ear normal, L ear normal, and no external deformities.   Nose:  congested     bilaterally no swelling or tenderness Mouth:  pharynx pink and moist.   no lesions Neck:  No deformities, masses, or tenderness noted. Lungs:  Normal  respiratory effort, chest expands symmetrically. Lungs are clear to auscultation, no crackles or wheezes.no dullness.   / ocass musical sound  Heart:  Normal rate and regular rhythm. S1 and S2 normal without gallop, murmur, click, rub or other extra sounds.no lifts.   Pulses:  nl cap refill  Neurologic:  non focal  Skin:  turgor normal and color normal.   Cervical Nodes:  No lymphadenopathy noted Psych:  Oriented X3, good eye contact, and not anxious appearing.     Impression & Recommendations:  Problem # 1:  SINUSITIS - ACUTE-NOS (ICD-461.9)  ? fever   although no long standing severity of signs  and cough   will empirically treat  . disc diff dx with pt  Her updated medication list for this problem includes:    Nasonex 50 Mcg/act Susp (Mometasone furoate) .Marland Kitchen... 2 spray each nares to control allergies    Azithromycin 250 Mg Tabs (Azithromycin) .Marland Kitchen... Take 2   day 1 and then 1 by mouth once daily  Instructed on treatment. Call if symptoms persist or worsen.   Problem # 2:  ALLERGIC RHINITIS (ICD-477.9)  Her updated medication list for this problem includes:    Nasonex 50 Mcg/act Susp (Mometasone furoate) .Marland Kitchen... 2 spray each nares to control allergies  Complete Medication List: 1)  Calcium-vitamin D 600-200 Mg-unit Tabs (Calcium-vitamin d) .... Once daily 2)  Multiple Vitamin Tabs (Multiple vitamin) 3)  Zovirax 5 % Crea (Acyclovir) .... Use as directed 4)  Trazodone Hcl 50 Mg Tabs (Trazodone hcl) .... 1/2 to 1 by mouth hs as needed sleep 5)  Nasonex 50 Mcg/act Susp (Mometasone furoate) .... 2 spray each nares to control allergies 6)  Icaps Caps (Multiple vitamins-minerals) 7)  Magnesium Oxide 250 Mg Tabs (Magnesium oxide) 8)  Vitamin B Complex-c Caps (B complex-c) 9)  Valtrex 1 Gm Tabs (Valacyclovir hcl) .... 2 by mouth two times a day 10)  Systane Preservative Free 0.4-0.3 % Soln (Polyethyl glycol-propyl glycol) 11)  Azithromycin 250 Mg Tabs (Azithromycin) .... Take 2   day 1 and  then 1 by mouth once daily  Patient Instructions: 1)  if have  a fever over 2-3 days.  then call  2)  continue treating the allergy.  try plain allegra    3)  decongestants can keep you awake.   4)  This seems like infection possibly on top of allergy.  Prescriptions: AZITHROMYCIN 250 MG TABS (AZITHROMYCIN) take 2   day 1 and then 1 by mouth once daily  #6 x 0   Entered and Authorized by:   [name] MD   Signed by:   [name] MD on 03/04/2010   Method used:   Electronically to        [facility] [phone]* (retail)  [address]       [address]       [place], [place]  [address]       Ph: [phone]       Fax: [phone]   RxID:   [id number]

## 2010-07-22 NOTE — Progress Notes (Signed)
Summary: referral to [facility] for bone density  Phone Note Call from Patient Call back at [facility] Phone [phone]   Caller: vm Reason for Call: Talk to Nurse Summary of Call: Got a letter saying to check with doctor to see if bone density is apporpriate for her.  Patient thinks last one was May 2 yr ago.  Needs referal, [facility].  [phone] Initial call taken by: Rudy Jew, RN,  April 07, 2010 12:21 PM  Follow-up for Phone Call        her last bone density  DEXA was with [place]  in [facility] 2010 . NOt due until november 2012 .  Follow-up by: [name] MD,  April 07, 2010 5:22 PM  Additional Follow-up for Phone Call Additional follow up Details #1::        returning a call. Additional Follow-up by: Warnell Forester,  April 08, 2010 11:08 AM    Additional Follow-up for Phone Call Additional follow up Details #2::    Pt Notified Follow-up by: Trixie Dredge,  April 08, 2010 2:38 PM

## 2010-07-22 NOTE — Progress Notes (Signed)
Summary: valtrex  Phone Note Call from Patient   Caller: Patient Call For: [name] MD Summary of Call: Pt is having cold sores and would like Valtrex sent to [facility] in [place].  Did not leave any phone numbers.  Will check the chart. Initial call taken by: Lynann Beaver CMA,  September 06, 2009 9:19 AM  Follow-up for Phone Call        valtrex 1000mg  take 2 by mouth two times a day as needed .  Disp: 30 refill x 3  Follow-up by: [name] MD,  September 06, 2009 11:09 AM  Additional Follow-up for Phone Call Additional follow up Details #1::        Left message for pt to call back. We need the number to call in the rx or have the pharmacy contact us. Additional Follow-up by: Romualdo Bolk, CMA Duncan Dull),  September 09, 2009 1:08 PM    Additional Follow-up for Phone Call Additional follow up Details #2::    Pt wants rx called into [place], [place] [phone] rx called in. Follow-up by: Romualdo Bolk, CMA Duncan Dull),  September 11, 2009 4:50 PM  New/Updated Medications: VALTREX 1 GM TABS (VALACYCLOVIR HCL) 2 by mouth two times a day Prescriptions: VALTREX 1 GM TABS (VALACYCLOVIR HCL) 2 by mouth two times a day  #30 x 3   Entered by:   Romualdo Bolk, CMA (AAMA)   Authorized by:   [name] MD   Signed by:   Romualdo Bolk, CMA (AAMA) on 09/11/2009   Method used:   Telephoned to ...       [facility] [phone]* (retail)       [address]       [address]       [place], [place]  [address]       Ph: [phone]       Fax: [phone]   RxID:   [id number]

## 2011-03-06 ENCOUNTER — Telehealth: Payer: Self-pay | Admitting: Internal Medicine

## 2011-03-06 NOTE — Telephone Encounter (Signed)
Pt called to scheduled cpx i  Informed pt what next available was in February. Pt is requesting to be worked in sooner because she only lives part time in [place] and will be leaving for [place] on October 25th and will not be back for 6 months. Please advise

## 2011-03-10 NOTE — Telephone Encounter (Signed)
Can use 10/23 at 8:15 and block 8:30

## 2011-03-13 NOTE — Telephone Encounter (Signed)
Pt called back to check on getting a cpx sch with Dr Fabian Sharp. Pt has been sch for cpx on 04/14/11 at 8:15am, as noted below. Pt has labs sch for 03/24/11.

## 2011-03-20 ENCOUNTER — Encounter: Payer: Self-pay | Admitting: Internal Medicine

## 2011-03-20 DIAGNOSIS — M8580 Other specified disorders of bone density and structure, unspecified site: Secondary | ICD-10-CM | POA: Insufficient documentation

## 2011-03-20 DIAGNOSIS — Z8669 Personal history of other diseases of the nervous system and sense organs: Secondary | ICD-10-CM | POA: Insufficient documentation

## 2011-03-24 ENCOUNTER — Other Ambulatory Visit (INDEPENDENT_AMBULATORY_CARE_PROVIDER_SITE_OTHER): Payer: PRIVATE HEALTH INSURANCE

## 2011-03-24 DIAGNOSIS — Z0000 Encounter for general adult medical examination without abnormal findings: Secondary | ICD-10-CM

## 2011-03-24 LAB — POCT URINALYSIS DIPSTICK
Bilirubin, UA: NEGATIVE
Blood, UA: NEGATIVE
Glucose, UA: NEGATIVE
Nitrite, UA: POSITIVE
Spec Grav, UA: 1.02
Urobilinogen, UA: 0.2
pH, UA: 6

## 2011-03-24 LAB — HEPATIC FUNCTION PANEL
ALT: 32 U/L (ref 0–35)
AST: 33 U/L (ref 0–37)
Albumin: 4.6 g/dL (ref 3.5–5.2)
Alkaline Phosphatase: 51 U/L (ref 39–117)

## 2011-03-24 LAB — CBC WITH DIFFERENTIAL/PLATELET
Eosinophils Absolute: 0 10*3/uL (ref 0.0–0.7)
HCT: 38.3 % (ref 36.0–46.0)
Lymphs Abs: 1.1 10*3/uL (ref 0.7–4.0)
MCHC: 33.3 g/dL (ref 30.0–36.0)
MCV: 93.9 fl (ref 78.0–100.0)
Monocytes Absolute: 0.5 10*3/uL (ref 0.1–1.0)
Neutrophils Relative %: 70.6 % (ref 43.0–77.0)
Platelets: 215 10*3/uL (ref 150.0–400.0)
RDW: 13.5 % (ref 11.5–14.6)

## 2011-03-24 LAB — TSH: TSH: 0.59 u[IU]/mL (ref 0.35–5.50)

## 2011-03-24 LAB — BASIC METABOLIC PANEL
Calcium: 9.2 mg/dL (ref 8.4–10.5)
GFR: 84.6 mL/min (ref >60.00)
Sodium: 141 mEq/L (ref 135–145)

## 2011-03-24 LAB — LIPID PANEL
Cholesterol: 222 mg/dL — ABNORMAL HIGH (ref 0–200)
Triglycerides: 38 mg/dL (ref 0.0–149.0)

## 2011-04-06 ENCOUNTER — Encounter: Payer: Self-pay | Admitting: Internal Medicine

## 2011-04-14 ENCOUNTER — Encounter: Payer: Self-pay | Admitting: Internal Medicine

## 2011-04-14 ENCOUNTER — Ambulatory Visit (INDEPENDENT_AMBULATORY_CARE_PROVIDER_SITE_OTHER): Payer: PRIVATE HEALTH INSURANCE | Admitting: Internal Medicine

## 2011-04-14 VITALS — BP 110/74 | HR 73 | Temp 98.9°F | Ht 62.0 in | Wt 131.0 lb

## 2011-04-14 DIAGNOSIS — M899 Disorder of bone, unspecified: Secondary | ICD-10-CM

## 2011-04-14 DIAGNOSIS — Z23 Encounter for immunization: Secondary | ICD-10-CM

## 2011-04-14 DIAGNOSIS — Z0000 Encounter for general adult medical examination without abnormal findings: Secondary | ICD-10-CM | POA: Insufficient documentation

## 2011-04-14 DIAGNOSIS — H3530 Unspecified macular degeneration: Secondary | ICD-10-CM

## 2011-04-14 DIAGNOSIS — E785 Hyperlipidemia, unspecified: Secondary | ICD-10-CM

## 2011-04-14 DIAGNOSIS — N390 Urinary tract infection, site not specified: Secondary | ICD-10-CM | POA: Insufficient documentation

## 2011-04-14 DIAGNOSIS — J309 Allergic rhinitis, unspecified: Secondary | ICD-10-CM

## 2011-04-14 DIAGNOSIS — M8580 Other specified disorders of bone density and structure, unspecified site: Secondary | ICD-10-CM

## 2011-04-14 MED ORDER — SULFAMETHOXAZOLE-TRIMETHOPRIM 800-160 MG PO TABS: 1.0000 | ORAL_TABLET | Freq: Two times a day (BID) | ORAL | Status: AC

## 2011-04-14 NOTE — Patient Instructions (Signed)
Take  Antibiotic for Urinary infection. Continue lifestyle intervention healthy eating and exercise . Recheck with wellness visit in a year . PAP smear for next year.

## 2011-04-14 NOTE — Progress Notes (Signed)
Subjective:    Patient ID: Paula Salas, female    DOB: [birth year], 55 y.o.   MRN: [id number]  HPI Patient comes in today for preventive visit and follow-up of medical issues. Update of her history since her last visit.  Allergic  On going but no sinus infection npow Macular degeneration getting shots in left eye no change in vision status Osteopenia no fractures exercising and ca vit d MS  Joint ms discomfort stable and exercise helps.  Review of Systems ROS:  GEN/ HEENTNo fever, significant weight changes sweats headaches vision problems hearing changes, CV/ PULM; No chest pain shortness of breath cough, syncope,edema  change in exercise tolerance. GI /GU: No adominal pain, vomiting, change in bowel habits. No blood in the stool. No significant GU symptoms. But some odor and frequency  Trying cranberry pills SKIN/HEME: ,no acute skin rashes suspicious lesions or bleeding. No lymphadenopathy, nodules, masses.  NEURO/ PSYCH:  No neurologic signs such as weakness numbness No depression anxiety. IMM/ Allergy: No unusual infections.  Allergy .   REST of 12 system review negative  Past history family history social history reviewed in the electronic medical record.     Objective:   Physical Exam Physical Exam: Vital signs reviewed JXB:JYNW is a well-developed well-nourished alert cooperative  white female who appears her stated age in no acute distress.  HEENT: normocephalic  traumatic , Eyes: PERRL EOM's full, conjunctiva clear, Nares: paten,t no deformity discharge or tenderness., Ears: no deformity EAC's clear TMs with normal landmarks. Mouth: clear OP, no lesions, edema.  Moist mucous membranes. Dentition in adequate repair. NECK: supple without masses, thyromegaly or bruits. CHEST/PULM:  Clear to auscultation and percussion breath sounds equal no wheeze , rales or rhonchi. No chest wall deformities or tenderness. Breast: normal by inspection . No dimpling, discharge, masses,  tenderness or discharge . Implants LN: no cervical axillary inguinal adenopathy CV: PMI is nondisplaced, S1 S2 no gallops, murmurs, rubs. Peripheral pulses are full without delay.No JVD .  ABDOMEN: Bowel sounds normal nontender  No guard or rebound, no hepato splenomegal no CVA tenderness.  No hernia. Extremtities:  No clubbing cyanosis or edema, no acute joint swelling or redness no focal atrophy NEURO:  Oriented x3, cranial nerves 3-12 appear to be intact, no obvious focal weakness,gait within normal limits no abnormal reflexes or asymmetrical SKIN: No acute rashes normal turgor, color, no bruising or petechiae. PSYCH: Oriented, good eye contact, no obvious depression anxiety, cognition and judgment appear normal.  Lab Results  Component Value Date   WBC 5.5 03/24/2011   HGB 12.7 03/24/2011   HCT 38.3 03/24/2011   PLT 215.0 03/24/2011   GLUCOSE 91 03/24/2011   CHOL 222* 03/24/2011   TRIG 38.0 03/24/2011   HDL 79.80 03/24/2011   LDLDIRECT 136.8 03/24/2011   ALT 32 03/24/2011   AST 33 03/24/2011   NA 141 03/24/2011   K 3.7 03/24/2011   CL 107 03/24/2011   CREATININE 0.8 03/24/2011   BUN 18 03/24/2011   CO2 25 03/24/2011   TSH 0.59 03/24/2011   UA pos nitrites and trc leuk and trc bl.     Assessment & Plan:  Preventive Health Care Counseled regarding healthy nutrition, exercise, sleep, injury prevention, calcium vit d and healthy weight .  UTI  Hx of same  Mild sx but definite  empiric rx a  Going out of town to [place] for winter tomorrow Osteopenia stable continue exercise Macular degeneration stable right eye  Allergic sinsu sx.  Using  mucinex antihistamines needed.  Nocturnal drainage cough at times no alarm features  LIPIDS better with   lsi  Check up in a year

## 2011-04-18 ENCOUNTER — Encounter: Payer: Self-pay | Admitting: Internal Medicine

## 2011-07-15 ENCOUNTER — Encounter: Payer: Self-pay | Admitting: Family

## 2011-07-15 ENCOUNTER — Ambulatory Visit (INDEPENDENT_AMBULATORY_CARE_PROVIDER_SITE_OTHER): Payer: PRIVATE HEALTH INSURANCE | Admitting: Family

## 2011-07-15 VITALS — BP 102/70 | Temp 98.4°F | Wt 130.0 lb

## 2011-07-15 DIAGNOSIS — R300 Dysuria: Secondary | ICD-10-CM

## 2011-07-15 DIAGNOSIS — N390 Urinary tract infection, site not specified: Secondary | ICD-10-CM

## 2011-07-15 LAB — POCT URINALYSIS DIPSTICK
Bilirubin, UA: NEGATIVE
Ketones, UA: NEGATIVE
Leukocytes, UA: NEGATIVE
Protein, UA: NEGATIVE
Spec Grav, UA: 1.015

## 2011-07-15 NOTE — Progress Notes (Signed)
  Subjective:    Patient ID: Paula Salas, female    DOB: May 02, 1956, 56 y.o.   MRN: [id number]  HPI 56 year old [place] female patient of Dr. Fabian Sharp presents today for recheck of urinary tract infection. She's been on Macrobid for one month. Had a urine culture that was positive for Escherichia coli. She originally presented with a foul-smelling urine but no other urinary symptoms. Those symptoms have since resolved since been on Macrobid. She denies any urinary frequency, urgency, blood in the urine, foul-smelling urine, back pain or abdominal pain.   Review of Systems  Respiratory: Negative.   Cardiovascular: Positive for palpitations.  Gastrointestinal: Negative.   Genitourinary: Negative.        Urinary odor has resolved  Skin: Negative.   Hematological: Negative.        Objective:   Physical Exam  Constitutional: She is oriented to person, place, and time. She appears well-developed and well-nourished.  Cardiovascular: Normal rate, regular rhythm and normal heart sounds.   Pulmonary/Chest: Effort normal and breath sounds normal.  Abdominal: Soft. Bowel sounds are normal.  Musculoskeletal: Normal range of motion.  Neurological: She is alert and oriented to person, place, and time.  Skin: Skin is warm and dry.  Psychiatric: She has a normal mood and affect.          Assessment & Plan:  Assessment urinary tract infection resolved  Plan: urine culture sent to be sure that her urine is completely clear. No patient in a result provider copy of the lab so that she may since her doctor in [place]. Recheck sooner when necessary.

## 2011-07-31 ENCOUNTER — Telehealth: Payer: Self-pay | Admitting: Internal Medicine

## 2011-07-31 NOTE — Telephone Encounter (Signed)
Pt aware of this. 

## 2011-07-31 NOTE — Telephone Encounter (Signed)
The culture wasn't collected per [facility]. Per [facility] Span- she did bring it to the lab. I left a message for pt to call back.

## 2011-07-31 NOTE — Telephone Encounter (Signed)
Pt called req to get results from urine culture done on 07/15/11. Pt also req to get a copy mailed to her in [place]. [address] [place], [place] [address].  Pls call pt asap.

## 2012-03-21 ENCOUNTER — Encounter: Payer: Self-pay | Admitting: Gastroenterology

## 2012-03-31 LAB — HM PAP SMEAR: HM Pap smear: NORMAL

## 2012-11-22 ENCOUNTER — Encounter: Payer: Self-pay | Admitting: Internal Medicine

## 2012-12-14 ENCOUNTER — Encounter: Payer: Self-pay | Admitting: Gastroenterology

## 2013-01-08 ENCOUNTER — Encounter: Payer: Self-pay | Admitting: Family Medicine

## 2013-01-08 ENCOUNTER — Ambulatory Visit (INDEPENDENT_AMBULATORY_CARE_PROVIDER_SITE_OTHER): Payer: BC Managed Care – PPO | Admitting: Family Medicine

## 2013-01-08 VITALS — BP 138/69 | HR 80 | Temp 98.0°F | Resp 16 | Ht 62.0 in | Wt 126.0 lb

## 2013-01-08 DIAGNOSIS — R12 Heartburn: Secondary | ICD-10-CM

## 2013-01-08 DIAGNOSIS — L500 Allergic urticaria: Secondary | ICD-10-CM

## 2013-01-08 DIAGNOSIS — R509 Fever, unspecified: Secondary | ICD-10-CM

## 2013-01-08 DIAGNOSIS — M273 Alveolitis of jaws: Secondary | ICD-10-CM

## 2013-01-08 DIAGNOSIS — Z8719 Personal history of other diseases of the digestive system: Secondary | ICD-10-CM

## 2013-01-08 DIAGNOSIS — K0510 Chronic gingivitis, plaque induced: Secondary | ICD-10-CM

## 2013-01-08 DIAGNOSIS — R197 Diarrhea, unspecified: Secondary | ICD-10-CM

## 2013-01-08 LAB — POCT CBC
Granulocyte percent: 64.6 %G (ref 37–80)
HCT, POC: 39.2 % (ref 37.7–47.9)
Hemoglobin: 12.4 g/dL (ref 12.2–16.2)
Lymph, poc: 2.2 (ref 0.6–3.4)
MCH, POC: 30.2 pg (ref 27–31.2)
MCHC: 31.6 g/dL — AB (ref 31.8–35.4)
MCV: 95.3 fL (ref 80–97)
MID (cbc): 0.5 (ref 0–0.9)
MPV: 9.5 fL (ref 0–99.8)
POC Granulocyte: 4.8 (ref 2–6.9)
POC LYMPH PERCENT: 29.1 %L (ref 10–50)
POC MID %: 6.3 %M (ref 0–12)
Platelet Count, POC: 223 10*3/uL (ref 142–424)
RBC: 4.11 M/uL (ref 4.04–5.48)
RDW, POC: 13.5 %
WBC: 7.4 10*3/uL (ref 4.6–10.2)

## 2013-01-08 MED ORDER — CHLORHEXIDINE GLUCONATE 0.12 % MT SOLN: 15.0000 mL | Freq: Two times a day (BID) | OROMUCOSAL | Status: DC

## 2013-01-08 MED ORDER — PREDNISONE 20 MG PO TABS: ORAL_TABLET | ORAL | Status: DC

## 2013-01-08 NOTE — Progress Notes (Signed)
This is a 57 year old Hispanic woman with a rash. Her history begins with a dental implant done on July 7. Subsequent to that procedure she developed an infection and was started on clindamycin. She continued swelling and a refill was called in for her. Beginning today, patient developed urticarial rash on her face, torso, arms and legs. Even her hands were itching.  Patient has had diarrhea since she started the clindamycin. She's checked and says there has been no blood. She also has mild cramping.  The rash described as itching and migratory.  Objective: Patient has urticarial rash on her right forehead left forehead, torso, extremities. Oropharynx: Absent tooth #8 where there has been an extraction and she has moderate swelling of the gum. Chest: Clear Heart: Regular no murmur Patient is alert, appropriate, in no acute distress Results for orders placed in visit on 01/08/13  POCT CBC      Result Value Range   WBC 7.4  4.6 - 10.2 K/uL   Lymph, poc 2.2  0.6 - 3.4   POC LYMPH PERCENT 29.1  10 - 50 %L   MID (cbc) 0.5  0 - 0.9   POC MID % 6.3  0 - 12 %M   POC Granulocyte 4.8  2 - 6.9   Granulocyte percent 64.6  37 - 80 %G   RBC 4.11  4.04 - 5.48 M/uL   Hemoglobin 12.4  12.2 - 16.2 g/dL   HCT, POC 16.1  09.6 - 47.9 %   MCV 95.3  80 - 97 fL   MCH, POC 30.2  27 - 31.2 pg   MCHC 31.6 (*) 31.8 - 35.4 g/dL   RDW, POC 04.5     Platelet Count, POC 223  142 - 424 K/uL   MPV 9.5  0 - 99.8 fL   Assessment: Post extraction infection of tooth #8; urticaria and diarrhea are secondary effects from the clindamycin.  Plan: Prednisone, continue oral washes with chlorhexidine gluconate, followup with dentist.  Signed, Elvina Sidle, MD

## 2013-01-08 NOTE — Patient Instructions (Addendum)
Dental Dry Socket After a tooth is pulled (extracted), blood fills up the hole (socket) where the tooth once was. This blood hardens (clots) and protects the bone and nerves underneath. Normally, gums completely grow over the top of the bones and nerves and close an open socket in about a week. After several months, the clot is replaced by bone that grows into the socket. However, when blood does not fill up the extraction socket, or the blood clot is lost for some reason, a dry socket may form. This condition leaves the bone and nerves exposed to air, food, liquid, or anything else that enters the mouth. The gums cannot grow over the extraction socket because there is nothing to grow over, and the dry socket remains open.  CAUSES   Blood not filling up the extraction socket properly.  Anything that can dislodge a forming blood clot. Forceful spitting or sucking through a straw can pull a blood clot completely out of the socket and cause a dry socket.  Having a difficult extraction. The forceful pushing against the wall of the socket when the tooth is extracted causes the walls of the tooth socket to become crushed. This prevents bleeding into the socket because the blood vessels have been crushed closed.  Alcoholic drinks may dry out the blood clot and cause a dry socket.  Smoking can disturb blood clot formation and cause a dry socket. Factors that put you at an increased risk for a dry socket include:   Having lower teeth extracted.  Being female.  Poor oral hygiene.  Taking birth control pills.  Having your wisdom teeth extracted. SYMPTOMS  Severe, constant, dull throbbing pain. The pain generally begins 2 to 3 days after the tooth extraction. The pain may last about a week after it begins.  Bad smelling breath and bad taste in your mouth.  Ear pain. HOME CARE INSTRUCTIONS  Follow your dentist's instructions.  Only take over-the-counter or prescription medicines for pain,  discomfort, or fever as directed by your caregiver. If pain medication does not relieve the pain, you may need to see your dentistwho can clean the socket and place a medicated dressing on the extraction to promote healing.  Take your antibiotics as told, if prescribed. Finish them even if you start to feel better.  Wait at least a day before rinsing with warm salt water to avoid possibly dissolving the new blood clot. When salt water rinsing, spit gently to avoid pressure on the clot.  Avoid carbonated beverages.  Avoid alcohol.  Avoid smoking for a few days after surgery. Patients who have recently had oral surgery should avoid anything that may irritate the extraction socket or anything that may cause the blood clot inside the extraction socket from being dislodged. Carefully follow your instructions for after surgery care.  SEEK IMMEDIATE DENTAL CARE IF:  Your medicine does not relieve pain.  You have uncontrolled bleeding, marked swelling, or severe pain.  You develop a fever above 102 F (38.9 C), not controlled by medication.  You have difficulty swallowing or cannot open your mouth.  You have other severe symptoms. Document Released: 12/13/2002 Document Revised: 08/31/2011 Document Reviewed: 09/10/2010 [facility] Patient Information 2014 [place], [place]. Hives Hives are itchy, red, swollen areas of the skin. They can vary in size and location on your body. Hives can come and go for hours or several days (acute hives) or for several weeks (chronic hives). Hives do not spread from person to person (noncontagious). They may get worse  with scratching, exercise, and emotional stress. CAUSES   Allergic reaction to food, additives, or drugs.  Infections, including the common cold.  Illness, such as vasculitis, lupus, or thyroid disease.  Exposure to sunlight, heat, or cold.  Exercise.  Stress.  Contact with chemicals. SYMPTOMS   Red or white swollen patches on the skin.  The patches may change size, shape, and location quickly and repeatedly.  Itching.  Swelling of the hands, feet, and face. This may occur if hives develop deeper in the skin. DIAGNOSIS  Your caregiver can usually tell what is wrong by performing a physical exam. Skin or blood tests may also be done to determine the cause of your hives. In some cases, the cause cannot be determined. TREATMENT  Mild cases usually get better with medicines such as antihistamines. Severe cases may require an emergency epinephrine injection. If the cause of your hives is known, treatment includes avoiding that trigger.  HOME CARE INSTRUCTIONS   Avoid causes that trigger your hives.  Take antihistamines as directed by your caregiver to reduce the severity of your hives. Non-sedating or low-sedating antihistamines are usually recommended. Do not drive while taking an antihistamine.  Take any other medicines prescribed for itching as directed by your caregiver.  Wear loose-fitting clothing.  Keep all follow-up appointments as directed by your caregiver. SEEK MEDICAL CARE IF:   You have persistent or severe itching that is not relieved with medicine.  You have painful or swollen joints. SEEK IMMEDIATE MEDICAL CARE IF:   You have a fever.  Your tongue or lips are swollen.  You have trouble breathing or swallowing.  You feel tightness in the throat or chest.  You have abdominal pain. These problems may be the first sign of a life-threatening allergic reaction. Call your local emergency services (911 in [place] MAKE SURE YOU:   Understand these instructions.  Will watch your condition.  Will get help right away if you are not doing well or get worse. Document Released: 06/08/2005 Document Revised: 12/08/2011 Document Reviewed: 09/01/2011 [facility] Patient Information 2014 [place], [place].

## 2013-01-16 ENCOUNTER — Telehealth: Payer: Self-pay | Admitting: Internal Medicine

## 2013-01-16 NOTE — Telephone Encounter (Signed)
Patient Information:  Caller Name: Ed[place]  Phone: [phone]  Patient: Paula Salas, Paula Salas  Gender: Female  DOB: 01/19/1966  Age: 57 Years  PCP: Berniece Andreas (Family Practice)  Pregnant: No  Office Follow Up:  Does the office need to follow up with this patient?: No  Instructions For The Office: N/A   Symptoms  Reason For Call & Symptoms: Akaysha states she had a dental implant on 12/26/12- was ordered Clindamycin. Had lip swelling. Had swelling to lip which required drainage. did not feel well on 12/28/12- diarrhea , cramps. On 01/01/13 went to ED due to widespread rash/hives. Was ordered Prednisone x 5 days due to hives with  drug reaction. States she still has itchy rash on head- can feel pinpoint sized  bumps on scalp- like no see-ums bites onset 7/18 after finishing Prednisone.  Using witch hazel for itching. Has pink pinpoint , raised itchy rash on knees , wrist and neck. Having pain/stiffness in her hands, and left foot and shoulder pain.Per widespread rash protocol has go to office now disposition due to joint pain. Is out of town on 01/16/13. Refused ED/UC. Requesting appointment in office on 01/17/13. Appt scheduled in [facility] on 01/17/13 at 1415 with Dr. Fabian Sharp. Care advice given.  Reviewed Health History In EMR: Yes  Reviewed Medications In EMR: Yes  Reviewed Allergies In EMR: Yes  Reviewed Surgeries / Procedures: Yes  Date of Onset of Symptoms: 01/06/2013  Treatments Tried: witch hazel, Aveeno  Treatments Tried Worked: No OB / GYN:  LMP: Unknown  Guideline(s) Used:  Rash or Redness - Widespread  Disposition Per Guideline:   Go to Office Now  Reason For Disposition Reached:   Joint pain or swelling  Advice Given:  For Itchy Rashes:  Wash the skin once with gentle non-perfumed soap to remove any irritants. Rinse the soap off thoroughly.  You may also take an oatmeal (Aveeno) bath or take an antihistamine medication by mouth to help reduce the itching.  Oral Antihistamine  Medication for Itching:  Take an antihistamine like diphenhydramine (Benadryl) for widespread rashes that itch. The adult dosage of Benadryl is 25-50 mg by mouth 4 times daily.  Contagiousness:  Avoid contact with pregnant women until a diagnosis is made. Most viral rashes are contagious (especially if a fever is present). You can return to work or school after the rash is gone or when your doctor says it is safe to return with the rash.  Expected Course:  Most viral rashes disappear within 48 hours.  Patient Refused Recommendation:  Patient Will Make Own Appointment  Is in [place] today 01/16/13

## 2013-01-17 ENCOUNTER — Ambulatory Visit (INDEPENDENT_AMBULATORY_CARE_PROVIDER_SITE_OTHER): Payer: BC Managed Care – PPO | Admitting: Internal Medicine

## 2013-01-17 ENCOUNTER — Encounter: Payer: Self-pay | Admitting: Internal Medicine

## 2013-01-17 VITALS — BP 134/80 | HR 74 | Temp 98.4°F | Wt 126.0 lb

## 2013-01-17 DIAGNOSIS — L299 Pruritus, unspecified: Secondary | ICD-10-CM

## 2013-01-17 DIAGNOSIS — L298 Other pruritus: Secondary | ICD-10-CM

## 2013-01-17 DIAGNOSIS — M2540 Effusion, unspecified joint: Secondary | ICD-10-CM

## 2013-01-17 DIAGNOSIS — T50904A Poisoning by unspecified drugs, medicaments and biological substances, undetermined, initial encounter: Secondary | ICD-10-CM

## 2013-01-17 NOTE — Progress Notes (Signed)
Chief Complaint  Patient presents with  . Itching    HPI: Patient comes in today for SDA for  new problem evaluation. Complaining today of some swelling around her wrists and itching also on her head. see previous note from urgent care. Scenario goes that she had a dental problem implant secondary infection placed on clindamycin and developed fever chills systemic rash and hives felt to be a significant reaction to the clindamycin. It was stopped and she was placed on prednisone. She finished the five-day course a few days ago. Since that time her rash is not recurred but has had some itching on her scalp back of her head and some swelling around her wrists feeling stiff. She didn't know what to do but took some antihistamine like Claritin and some Singulair that she had left over from previous allergy evaluations. She states that she is a lot better than she was but is concerned about what to do next.   ROS: See pertinent positives and negatives per HPI. Negative chest pain shortness of breath nausea vomiting or diarrhea  Past Medical History  Diagnosis Date  . Osteopenia     dexa 98 -1.4 and , -1.1, 99 -1.77 and -1.9, 2010 -1.9 -1.6 on bisphosphonate from 12  2003 to 4/07  . Macular degeneration   . History of retinal hemorrhage   . Hyperlipidemia     Family History  Problem Relation Age of Onset  . Osteoporosis      History   Social History  . Marital Status: Single    Spouse Name: N/A    Number of Children: N/A  . Years of Education: N/A   Social History Main Topics  . Smoking status: Never Smoker   . Smokeless tobacco: None  . Alcohol Use: No  . Drug Use: None  . Sexually Active: None   Other Topics Concern  . None   Social History Narrative   Single   Back to [facility] the [facility] in [place]   [facility] of 1    No pets          Outpatient Encounter Prescriptions as of 01/17/2013  Medication Sig Dispense Refill  . acyclovir (ZOVIRAX) 5 % ointment Apply topically  every 3 (three) hours.        . B Complex-C (B-COMPLEX WITH VITAMIN C) tablet Take 1 tablet by mouth daily.        . calcium-vitamin D (OSCAL WITH D) 500-200 MG-UNIT per tablet Take 1 tablet by mouth daily.        . chlorhexidine (PERIDEX) 0.12 % solution Use as directed 15 mLs in the mouth or throat 2 (two) times daily.  120 mL  0  . loratadine (CLARITIN) 10 MG tablet Take 10 mg by mouth daily.      . magnesium oxide (MAG-OX) 400 MG tablet Take 400 mg by mouth daily.        . mometasone (NASONEX) 50 MCG/ACT nasal spray Place 2 sprays into the nose daily.        . montelukast (SINGULAIR) 10 MG tablet Take 10 mg by mouth at bedtime.      . Multiple Vitamins-Minerals (ICAPS) CAPS Take by mouth.        Bertram Gala Glycol-Propyl Glycol (SYSTANE PRESERVATIVE FREE OP) Apply to eye.        . predniSONE (DELTASONE) 20 MG tablet 2 daily with food  10 tablet  1  . valACYclovir (VALTREX) 1000 MG tablet Take 1,000 mg by mouth  2 (two) times daily. 2 bid        No facility-administered encounter medications on file as of 01/17/2013.    EXAM:  BP 134/80  Pulse 74  Temp(Src) 98.4 F (36.9 C) (Oral)  Wt 126 lb (57.153 kg)  BMI 23.04 kg/m2  SpO2 98%  Body mass index is 23.04 kg/(m^2).  GENERAL: vitals reviewed and listed above, alert, oriented, appears well hydrated and in no acute distress nontoxic normal speech HEENT: atraumatic, conjunctiva  clear, no obvious abnormalities on inspection of external nose and ears OP : no lesion edema or exudate as upper mouth guard temporary tooth ;socket looks clear without obvious abscess or lesion NECK: no obvious masses on inspection palpation no adenopathy noted LUNGS: clear to auscultation bilaterally, no wheezes, rales or rhonchi, good air movement CV: HRRR, no clubbing cyanosis or  peripheral edema nl cap refill  MS: moves all extremities  both wrists looks slightly swollen and pink but no warmth good range of motion no point tenderness scalp shows no acute  rash which is some redness where she is scratched. PSYCH: pleasant and cooperative  ASSESSMENT AND PLAN:  Discussed the following assessment and plan:  Joint swelling  Itching due to drug She has an upcoming appointment in a few weeks I suggest she stay on antihistamine could always add the prednisone back up relapsing but drug avoidance in time would be the best plan at this time. -Patient advised to return or notify health care team  if symptoms worsen or persist or new concerns arise.  Patient Instructions  Ok to  Continue antihistamine    And  singulair   I think this is from the drug reaction.   Expect to be gone b nyext week however if getting worse contact us .  Consider more prednisone  Etc.   otherwise keep august appt.   Neta Mends. Conni Knighton M.D.

## 2013-01-17 NOTE — Patient Instructions (Signed)
Ok to  Continue antihistamine    And  singulair   I think this is from the drug reaction.   Expect to be gone b nyext week however if getting worse contact us .  Consider more prednisone  Etc.   otherwise keep august appt.

## 2013-01-18 DIAGNOSIS — L298 Other pruritus: Secondary | ICD-10-CM | POA: Insufficient documentation

## 2013-01-18 DIAGNOSIS — M2540 Effusion, unspecified joint: Secondary | ICD-10-CM | POA: Insufficient documentation

## 2013-01-19 ENCOUNTER — Telehealth: Payer: Self-pay | Admitting: Internal Medicine

## 2013-01-19 NOTE — Telephone Encounter (Signed)
Patient Information:  Caller Name: [place]  Phone: [phone]  Patient: Paula Salas  Gender: Female  DOB: 08-27-1955  Age: 57 Years  PCP: Berniece Andreas (Family Practice)  Office Follow Up:  Does the office need to follow up with this patient?: No  Instructions For The Office: N/A  RN Note:  Reports cannot lift right arm all the way up due to pain. Tender itchy or prickly rash without blisters noted under left breast since last night. Requested appointment for 01/20/13 since Dr Fabian Sharp is not in the office this afternoon.   Symptoms  Reason For Call & Symptoms: Follow up call regarding migrating joint pain.  Currently having headache and pain in right shoulder, right wrist pain and inner thigh pain. Pain is worse at night.  Wrist and shoulder pain rated 5-6/10. Seen 01/18/13.  Completed 5 days of Prednisone on 01/08/13. Right hand becomes numb intermittently.  Reviewed Health History In EMR: Yes  Reviewed Medications In EMR: Yes  Reviewed Allergies In EMR: Yes  Reviewed Surgeries / Procedures: Yes  Date of Onset of Symptoms: 01/13/2013  Treatments Tried: Aleve, Mucinex  Treatments Tried Worked: Yes  Guideline(s) Used:  Arm Pain  Disposition Per Guideline:   See Today in Office  Reason For Disposition Reached:   Localized rash is very painful (no fever)  Advice Given:  N/A  Patient Will Follow Care Advice:  YES  Appointment Scheduled:  01/20/2013 08:30:00 Appointment Scheduled Provider:  Berniece Andreas (Family Practice)

## 2013-01-19 NOTE — Telephone Encounter (Signed)
FYI

## 2013-01-20 ENCOUNTER — Encounter: Payer: Self-pay | Admitting: Internal Medicine

## 2013-01-20 ENCOUNTER — Ambulatory Visit (INDEPENDENT_AMBULATORY_CARE_PROVIDER_SITE_OTHER): Payer: BC Managed Care – PPO | Admitting: Internal Medicine

## 2013-01-20 VITALS — BP 104/70 | HR 67 | Temp 98.5°F | Wt 125.0 lb

## 2013-01-20 DIAGNOSIS — T50905S Adverse effect of unspecified drugs, medicaments and biological substances, sequela: Secondary | ICD-10-CM

## 2013-01-20 DIAGNOSIS — Z299 Encounter for prophylactic measures, unspecified: Secondary | ICD-10-CM

## 2013-01-20 DIAGNOSIS — M2550 Pain in unspecified joint: Secondary | ICD-10-CM

## 2013-01-20 DIAGNOSIS — L299 Pruritus, unspecified: Secondary | ICD-10-CM

## 2013-01-20 DIAGNOSIS — T50905A Adverse effect of unspecified drugs, medicaments and biological substances, initial encounter: Secondary | ICD-10-CM | POA: Insufficient documentation

## 2013-01-20 DIAGNOSIS — R7989 Other specified abnormal findings of blood chemistry: Secondary | ICD-10-CM

## 2013-01-20 LAB — HEPATIC FUNCTION PANEL
Albumin: 3.9 g/dL (ref 3.5–5.2)
Alkaline Phosphatase: 64 U/L (ref 39–117)
Total Bilirubin: 0.6 mg/dL (ref 0.3–1.2)

## 2013-01-20 LAB — CBC WITH DIFFERENTIAL/PLATELET
Basophils Absolute: 0 10*3/uL (ref 0.0–0.1)
Basophils Relative: 0.8 % (ref 0.0–3.0)
Eosinophils Absolute: 0.1 10*3/uL (ref 0.0–0.7)
Eosinophils Relative: 2.2 % (ref 0.0–5.0)
HCT: 36.5 % (ref 36.0–46.0)
Lymphs Abs: 1.4 10*3/uL (ref 0.7–4.0)
MCV: 93.3 fl (ref 78.0–100.0)
Monocytes Absolute: 0.6 10*3/uL (ref 0.1–1.0)
Monocytes Relative: 12 % (ref 3.0–12.0)
RBC: 3.91 Mil/uL (ref 3.87–5.11)
WBC: 4.8 10*3/uL (ref 4.5–10.5)

## 2013-01-20 LAB — BASIC METABOLIC PANEL
Chloride: 104 mEq/L (ref 96–112)
GFR: 95.75 mL/min (ref >60.00)
Potassium: 4.1 mEq/L (ref 3.5–5.1)
Sodium: 140 mEq/L (ref 135–145)

## 2013-01-20 LAB — T4, FREE: Free T4: 1.03 ng/dL (ref 0.60–1.60)

## 2013-01-20 LAB — POCT URINALYSIS DIP (MANUAL ENTRY)
Bilirubin, UA: NEGATIVE
Blood, UA: NEGATIVE
Ketones, POC UA: NEGATIVE
pH, UA: 6.5

## 2013-01-20 LAB — LIPID PANEL
HDL: 62.3 mg/dL (ref >39.00)
VLDL: 17 mg/dL (ref 0.0–40.0)

## 2013-01-20 MED ORDER — PREDNISONE 10 MG PO TABS: ORAL_TABLET | ORAL | Status: DC

## 2013-01-20 NOTE — Progress Notes (Signed)
Chief Complaint  Patient presents with  . Joint Pain    Itching    HPI: Patient comes in today for SDA for   problem evaluation. Since her visit earlier this week she is having increasing joint pains that is migratory. The wrists are better but then her right shoulder than her left shoulder and now her right groin pain is happening. Continued itching had a area of soreness that she thought was a rash at her left lower rib cage that she marked for [place] it doesn't hurt her now. His been taking the antihistamine. No fever but does get chills no vomiting diarrhea feels like she has a rash even though she can't see one. Denies cold sores. She did not have any of this joint pain before the medication was given clindamycin in the infection.  This told her that her infection was better she believes. ROS: See pertinent positives and negatives per HPI. No chest pain shortness of breath syncope has had her eye swelling times.  Past Medical History  Diagnosis Date  . Osteopenia     dexa 98 -1.4 and , -1.1, 99 -1.77 and -1.9, 2010 -1.9 -1.6 on bisphosphonate from 12  2003 to 4/07  . Macular degeneration   . History of retinal hemorrhage   . Hyperlipidemia     Family History  Problem Relation Age of Onset  . Osteoporosis      History   Social History  . Marital Status: Single    Spouse Name: N/A    Number of Children: N/A  . Years of Education: N/A   Social History Main Topics  . Smoking status: Never Smoker   . Smokeless tobacco: None  . Alcohol Use: No  . Drug Use: None  . Sexually Active: None   Other Topics Concern  . None   Social History Narrative   Single   Back to [facility] for the winter BF in [place]   [facility] of 1    No pets          Outpatient Encounter Prescriptions as of 01/20/2013  Medication Sig Dispense Refill  . acyclovir (ZOVIRAX) 5 % ointment Apply topically every 3 (three) hours.        . Ascorbic Acid (VITAMIN C) 100 MG tablet Take 100 mg by mouth daily.      . Bacillus  Coagulans-Inulin (PROBIOTIC-PREBIOTIC) 1-250 BILLION-MG CAPS Take by mouth.      . calcium-vitamin D (OSCAL WITH D) 500-200 MG-UNIT per tablet Take 1 tablet by mouth daily.        . Cranberry 125 MG TABS Take by mouth.      Marland Kitchen guaiFENesin (ROBITUSSIN) 100 MG/5ML liquid Take 200 mg by mouth 3 (three) times daily as needed for cough.      . loratadine (CLARITIN) 10 MG tablet Take 10 mg by mouth daily.      . magnesium oxide (MAG-OX) 400 MG tablet Take 400 mg by mouth daily.        . mometasone (NASONEX) 50 MCG/ACT nasal spray Place 2 sprays into the nose daily.        . montelukast (SINGULAIR) 10 MG tablet Take 10 mg by mouth at bedtime.      . Multiple Vitamins-Minerals (ICAPS) CAPS Take by mouth.        Bertram Gala Glycol-Propyl Glycol (SYSTANE PRESERVATIVE FREE OP) Apply to eye.        Marland Kitchen UNABLE TO FIND Med Name: bill berry      .  valACYclovir (VALTREX) 1000 MG tablet Take 1,000 mg by mouth 2 (two) times daily. 2 bid       . predniSONE (DELTASONE) 10 MG tablet Take pills per day,6,6,6,4,4,4,2,2,2,1,1,1 for allergic reaction  40 tablet  0  . [DISCONTINUED] B Complex-C (B-COMPLEX WITH VITAMIN C) tablet Take 1 tablet by mouth daily.        . [DISCONTINUED] chlorhexidine (PERIDEX) 0.12 % solution Use as directed 15 mLs in the mouth or throat 2 (two) times daily.  120 mL  0  . [DISCONTINUED] predniSONE (DELTASONE) 20 MG tablet 2 daily with food  10 tablet  1   No facility-administered encounter medications on file as of 01/20/2013.    EXAM:  BP 104/70  Pulse 67  Temp(Src) 98.5 F (36.9 C) (Oral)  Wt 125 lb (56.7 kg)  BMI 22.86 kg/m2  SpO2 98%  Body mass index is 22.86 kg/(m^2).  GENERAL: vitals reviewed and listed above, alert, oriented, appears well hydrated and in no acute distress a bit anxious but looks well. No hives or edema of her face HEENT: atraumatic, conjunctiva  clear, no obvious abnormalities on inspection of external nose and ears upper mouth guard implant in place OP : no  lesion edema or exudate  NECK: no obvious masses on inspection palpation no adenopathy LUNGS: clear to auscultation bilaterally, no wheezes, rales or rhonchi, good air movement CV: HRRR, no clubbing cyanosis or  peripheral edema nl cap refill  Abdomen soft without organomegaly guarding or rebound MS: moves all extremities  wrists do not look swollen anymore no obvious joint effusion right shoulder with some stiffness but can elevate it points to the right groin radiating to the knee but okay range of motion and no limp Skin; no rash for the area of discomfort last night on the chest wall. There is a blotchy redness rash on the right neck and upper shoulder area without vesicles or papules that she states itches. A few on the left side.  PSYCH: pleasant and cooperative, no obvious depression  mildly anxious and worried anxiety  ASSESSMENT AND PLAN:  Discussed the following assessment and plan:  Multiple joint pain - Plan: Bacillus Coagulans-Inulin (PROBIOTIC-PREBIOTIC) 1-250 BILLION-MG CAPS, Ascorbic Acid (VITAMIN C) 100 MG tablet, Cranberry 125 MG TABS, UNABLE TO FIND, guaiFENesin (ROBITUSSIN) 100 MG/5ML liquid, predniSONE (DELTASONE) 10 MG tablet, Basic metabolic panel, CBC with Differential, Hepatic function panel, Lipid panel, TSH, T4, free, POCT urinalysis dipstick, C-reactive protein, Sedimentation rate, Parvovirus B19 antibody, IgG and IgM, Cyclic citrul peptide antibody, IgG  Drug reaction, sequela - Plan: Bacillus Coagulans-Inulin (PROBIOTIC-PREBIOTIC) 1-250 BILLION-MG CAPS, Ascorbic Acid (VITAMIN C) 100 MG tablet, Cranberry 125 MG TABS, UNABLE TO FIND, guaiFENesin (ROBITUSSIN) 100 MG/5ML liquid, predniSONE (DELTASONE) 10 MG tablet, Basic metabolic panel, CBC with Differential, Hepatic function panel, Lipid panel, TSH, T4, free, POCT urinalysis dipstick, C-reactive protein, Sedimentation rate, Parvovirus B19 antibody, IgG and IgM, Cyclic citrul peptide antibody, IgG  Preventive measure -  Plan: Bacillus Coagulans-Inulin (PROBIOTIC-PREBIOTIC) 1-250 BILLION-MG CAPS, Ascorbic Acid (VITAMIN C) 100 MG tablet, Cranberry 125 MG TABS, UNABLE TO FIND, guaiFENesin (ROBITUSSIN) 100 MG/5ML liquid, predniSONE (DELTASONE) 10 MG tablet, Basic metabolic panel, CBC with Differential, Hepatic function panel, Lipid panel, TSH, T4, free, POCT urinalysis dipstick, C-reactive protein, Sedimentation rate, Parvovirus B19 antibody, IgG and IgM, Cyclic citrul peptide antibody, IgG  Itching Itching and blotchiness on the right shoulder area but no evidence of shingles as this is not just in a dermatome and she states it only itches  Patient is  due for routine labs in 2 weeks we'll get them today in addition to anti-inflammatory markers CCP parvovirus antibodies. No obvious infection on her exam today she has migratory joint pains large joints. I suspect they have increased in intensity off the prednisone longer. We'll give a full prednisone course after lab tests done. And followup. If not improving we may get other opinions. -Patient advised to return or notify health care team  if symptoms worsen or persist or new concerns arise.  Patient Instructions  Seems like  A continued reaction to the clindamycin .  Now that you  Are off the prednisone. Doesn't seem like a continued infection based on exam and history.   Labs today  .   Early  And go back on a course of prednisone to last longer.  Ok to take the  Antihistamine still.   Contact us if you get fever  Or worsening of symptoms.     Neta Mends. Addilynne Olheiser M.D.

## 2013-01-20 NOTE — Patient Instructions (Addendum)
Seems like  A continued reaction to the clindamycin .  Now that you  Are off the prednisone. Doesn't seem like a continued infection based on exam and history.   Labs today  .   Early  And go back on a course of prednisone to last longer.  Ok to take the  Antihistamine still.   Contact us if you get fever  Or worsening of symptoms.

## 2013-01-23 LAB — CYCLIC CITRUL PEPTIDE ANTIBODY, IGG: Cyclic Citrullin Peptide Ab: <2 U/mL (ref 0.0–5.0)

## 2013-02-02 ENCOUNTER — Other Ambulatory Visit: Payer: PRIVATE HEALTH INSURANCE

## 2013-02-08 ENCOUNTER — Encounter: Payer: Self-pay | Admitting: Internal Medicine

## 2013-02-08 ENCOUNTER — Ambulatory Visit (INDEPENDENT_AMBULATORY_CARE_PROVIDER_SITE_OTHER): Payer: BC Managed Care – PPO | Admitting: Internal Medicine

## 2013-02-08 VITALS — BP 110/68 | HR 57 | Temp 98.1°F | Ht 62.0 in | Wt 130.0 lb

## 2013-02-08 DIAGNOSIS — Z0000 Encounter for general adult medical examination without abnormal findings: Secondary | ICD-10-CM

## 2013-02-08 DIAGNOSIS — H3530 Unspecified macular degeneration: Secondary | ICD-10-CM

## 2013-02-08 DIAGNOSIS — Z5189 Encounter for other specified aftercare: Secondary | ICD-10-CM

## 2013-02-08 DIAGNOSIS — T50905D Adverse effect of unspecified drugs, medicaments and biological substances, subsequent encounter: Secondary | ICD-10-CM

## 2013-02-08 DIAGNOSIS — M899 Disorder of bone, unspecified: Secondary | ICD-10-CM

## 2013-02-08 NOTE — Patient Instructions (Signed)
Continue lifestyle intervention healthy eating and exercise . Get [place] a copy of bone density when you get it done.  cpx in a year.  Or as needed.

## 2013-02-08 NOTE — Progress Notes (Signed)
Chief Complaint  Patient presents with  . Annual Exam    HPI: Patient comes in today for Preventive Health Care visit  Since last visit  Took About 8-9 day  Of pred    Off since about 10 days her joints are doing so much better and she has no more itching and hasn't relapsed. Still has dental procedures to do .  Macular degeneration:  Eye gets  Shots  allia  fro about  18 months every 8 weeks.  Some help  [place] and [place].   specialist is doing better with stability. She had a Pap smear and mammogram last year when she was in [place] can she can get it at lower cost. She will get a DEXA scan this year when she goes to visit her mom who is suffering from early dementia. She will get [place] a copy. No fracture no gait disturbance. ROS:  GEN/ HEENT: No fever, significant weight changes sweats headaches  hearing changes, CV/ PULM; No chest pain shortness of breath cough, syncope,edema  change in exercise tolerance. GI /GU: No adominal pain, vomiting, change in bowel habits. No blood in the stool. No significant GU symptoms. SKIN/HEME: ,no acute skin rashes suspicious lesions or bleeding. No lymphadenopathy, nodules, masses.  NEURO/ PSYCH:  No neurologic signs such as weakness numbness. No depression anxiety. IMM/ Allergy: No unusual infections.  Allergy .   REST of 12 system review negative except as per HPI   Past Medical History  Diagnosis Date  . Osteopenia     dexa 98 -1.4 and , -1.1, 99 -1.77 and -1.9, 2010 -1.9 -1.6 on bisphosphonate from 12  2003 to 4/07  . Macular degeneration   . History of retinal hemorrhage   . Hyperlipidemia     Family History  Problem Relation Age of Onset  . Osteoporosis Other   . Colon cancer      History   Social History  . Marital Status: Single    Spouse Name: N/A    Number of Children: N/A  . Years of Education: N/A   Social History Main Topics  . Smoking status: Never Smoker   . Smokeless tobacco: None  . Alcohol Use: No  . Drug Use:  None  . Sexual Activity: None   Other Topics Concern  . None   Social History Narrative   Single   Back to [facility] for the winter    [facility] of 1    No pets   Sept to go to [place]  To visit mom  Dementia for a month.  Then [facility] area Till April              Outpatient Encounter Prescriptions as of 02/08/2013  Medication Sig Dispense Refill  . acyclovir (ZOVIRAX) 5 % ointment Apply topically every 3 (three) hours.        . Ascorbic Acid (VITAMIN C) 100 MG tablet Take 100 mg by mouth daily.      . Bacillus Coagulans-Inulin (PROBIOTIC-PREBIOTIC) 1-250 BILLION-MG CAPS Take by mouth.      . calcium-vitamin D (OSCAL WITH D) 500-200 MG-UNIT per tablet Take 1 tablet by mouth daily.        . Cranberry 125 MG TABS Take by mouth.      . loratadine (CLARITIN) 10 MG tablet Take 10 mg by mouth daily.      . magnesium oxide (MAG-OX) 400 MG tablet Take 400 mg by mouth daily.        Marland Kitchen  mometasone (NASONEX) 50 MCG/ACT nasal spray Place 2 sprays into the nose daily.        Bertram Gala Glycol-Propyl Glycol (SYSTANE PRESERVATIVE FREE OP) Apply to eye.        Marland Kitchen UNABLE TO FIND Med Name: bill berry      . valACYclovir (VALTREX) 1000 MG tablet Take 1,000 mg by mouth 2 (two) times daily. 2 bid       . [DISCONTINUED] guaiFENesin (ROBITUSSIN) 100 MG/5ML liquid Take 200 mg by mouth 3 (three) times daily as needed for cough.      . [DISCONTINUED] montelukast (SINGULAIR) 10 MG tablet Take 10 mg by mouth at bedtime.      . [DISCONTINUED] Multiple Vitamins-Minerals (ICAPS) CAPS Take by mouth.        . [DISCONTINUED] predniSONE (DELTASONE) 10 MG tablet Take pills per day,6,6,6,4,4,4,2,2,2,1,1,1 for allergic reaction  40 tablet  0   No facility-administered encounter medications on file as of 02/08/2013.    EXAM:  BP 110/68  Pulse 57  Temp(Src) 98.1 F (36.7 C) (Oral)  Ht 5\' 2"  (1.575 m)  Wt 130 lb (58.968 kg)  BMI 23.77 kg/m2  SpO2 98%  Body mass index is 23.77 kg/(m^2).  Physical Exam: Vital  signs reviewed GNF:AOZH is a well-developed well-nourished alert cooperative   female who appears her stated age in no acute distress.  HEENT: normocephalic atraumatic , Eyes: PERRL EOM's full, conjunctiva clear, Nares: paten,t no deformity discharge or tenderness., Ears: no deformity EAC's clear TMs with normal landmarks. Mouth: clear OP, no lesions, edema.  Moist mucous membranes. Dentition has mouthpiece in place no obvious redness. NECK: supple without masses, thyromegaly or bruits. CHEST/PULM:  Clear to auscultation and percussion breath sounds equal no wheeze , rales or rhonchi. No chest wall deformities or tenderness. Breast: normal by inspection . No dimpling, discharge, masses, tenderness or discharge .  implants. CV: PMI is nondisplaced, S1 S2 no gallops, murmurs, rubs. Peripheral pulses are full without delay.No JVD .  ABDOMEN: Bowel sounds normal nontender  No guard or rebound, no hepato splenomegal no CVA tenderness.  No hernia. Extremtities:  No clubbing cyanosis or edema, no acute joint swelling or redness no focal atrophy NEURO:  Oriented x3, cranial nerves 3-12 appear to be intact, no obvious focal weakness,gait within normal limits no abnormal reflexes or asymmetrical SKIN: No acute rashes normal turgor, color, no bruising or petechiae. Some changes PSYCH: Oriented, good eye contact, no obvious depression anxiety, cognition and judgment appear normal. LN: no cervical axillary inguinal adenopathy  Lab Results  Component Value Date   WBC 4.8 01/20/2013   HGB 12.2 01/20/2013   HCT 36.5 01/20/2013   PLT 248.0 01/20/2013   GLUCOSE 89 01/20/2013   CHOL 212* 01/20/2013   TRIG 85.0 01/20/2013   HDL 62.30 01/20/2013   LDLDIRECT 129.8 01/20/2013   ALT 12 01/20/2013   AST 16 01/20/2013   NA 140 01/20/2013   K 4.1 01/20/2013   CL 104 01/20/2013   CREATININE 0.7 01/20/2013   BUN 13 01/20/2013   CO2 29 01/20/2013   TSH 1.82 01/20/2013    ASSESSMENT AND PLAN:  Discussed the following assessment and  plan:  Encounter for preventive health examination  Drug reaction, subsequent encounter  OSTEOPENIA  MACULAR DEGENERATION Doing much better after drug reaction resolved no musculoskeletal symptoms. Appears to be doing well under treatment for her eye disease no fracture will get a she is up-to-date on other healthcare parameters. Counseled followup in a year or earlier as  needed. Patient Care Team: [name], MD as PCP - General Edmon Crape, MD as Consulting Physician (Ophthalmology) Patient Instructions  Continue lifestyle intervention healthy eating and exercise . Get [place] a copy of bone density when you get it done.  cpx in a year.  Or as needed.     Neta Mends. Panosh M.D. Health Maintenance  Topic Date Due  . Influenza Vaccine  02/20/2013  . Mammogram  03/31/2014  . Pap Smear  04/01/2015  . Colonoscopy  04/10/2017  . Tetanus/tdap  05/09/2019   Health Maintenance Review

## 2013-04-27 ENCOUNTER — Other Ambulatory Visit: Payer: Self-pay

## 2013-05-15 ENCOUNTER — Ambulatory Visit (INDEPENDENT_AMBULATORY_CARE_PROVIDER_SITE_OTHER): Payer: BC Managed Care – PPO | Admitting: Internal Medicine

## 2013-05-15 ENCOUNTER — Encounter: Payer: Self-pay | Admitting: Internal Medicine

## 2013-05-15 VITALS — BP 126/70 | HR 58 | Temp 98.4°F | Wt 129.0 lb

## 2013-05-15 DIAGNOSIS — M899 Disorder of bone, unspecified: Secondary | ICD-10-CM

## 2013-05-15 DIAGNOSIS — R350 Frequency of micturition: Secondary | ICD-10-CM

## 2013-05-15 DIAGNOSIS — R209 Unspecified disturbances of skin sensation: Secondary | ICD-10-CM

## 2013-05-15 DIAGNOSIS — R200 Anesthesia of skin: Secondary | ICD-10-CM

## 2013-05-15 DIAGNOSIS — M8580 Other specified disorders of bone density and structure, unspecified site: Secondary | ICD-10-CM

## 2013-05-15 DIAGNOSIS — E785 Hyperlipidemia, unspecified: Secondary | ICD-10-CM

## 2013-05-15 LAB — POCT URINALYSIS DIPSTICK
Ketones, UA: NEGATIVE
Nitrite, UA: NEGATIVE
Protein, UA: NEGATIVE
pH, UA: 7

## 2013-05-15 LAB — HM PAP SMEAR: HM Pap smear: NORMAL

## 2013-05-15 NOTE — Patient Instructions (Addendum)
I believe the problems with her right upper extremity are mechanical pinched nerves and possibly tendinitis.  If persistent progressive or you get weakness we should have a hand specialist see your arm. Otherwise continue as you're doing. This is not from diabetes  Continue optimization of bone health  Plan check blood work before visit in May cholesterol and chemistry.  Bone Health Our bones do many things. They provide structure, protect organs, anchor muscles, and store calcium. Adequate calcium in your diet and weight-bearing physical activity help build strong bones, improve bone amounts, and may reduce the risk of weakening of bones (osteoporosis) later in life. PEAK BONE MASS By age 52, the average woman has acquired most of her skeletal bone mass. A large decline occurs in older adults which increases the risk of osteoporosis. In women this occurs around the time of menopause. It is important for young girls to reach their peak bone mass in order to maintain bone health throughout life. A person with high bone mass as a young adult will be more likely to have a higher bone mass later in life. Not enough calcium consumption and physical activity early on could result in a failure to achieve optimum bone mass in adulthood. OSTEOPOROSIS Osteoporosis is a disease of the bones. It is defined as low bone mass with deterioration of bone structure. Osteoporosis leads to an increase risk of fractures with falls. These fractures commonly happen in the wrist, hip, and spine. While men and women of all ages and background can develop osteoporosis, some of the risk factors for osteoporosis are:  Female.  White.  Postmenopausal.  Older adults.  Small in body size.  Eating a diet low in calcium.  Physically inactive.  Smoking.  Use of some medications.  Family history. CALCIUM Calcium is a mineral needed by the body for healthy bones, teeth, and proper function of the heart, muscles,  and nerves. The body cannot produce calcium so it must be absorbed through food. Good sources of calcium include:  Dairy products (low fat or nonfat milk, cheese, and yogurt).  Dark green leafy vegetables (bok choy and broccoli).  Calcium fortified foods (orange juice, cereal, bread, soy beverages, and tofu products).  Nuts (almonds). Recommended amounts of calcium vary for individuals. RECOMMENDED CALCIUM INTAKES Age and Amount in mg per day  Children 1 to 3 years / 700 mg  Children 4 to 8 years / 1,000 mg  Children 9 to 13 years / 1,300 mg  Teens 14 to 18 years / 1,300 mg  Adults 19 to 50 years / 1,000 mg  Adult women 51 to 70 years / 1,200 mg  Adults 71 years and older / 1,200 mg  Pregnant and breastfeeding teens / 1,300 mg  Pregnant and breastfeeding adults / 1,000 mg Vitamin D also plays an important role in healthy bone development. Vitamin D helps in the absorption of calcium. WEIGHT-BEARING PHYSICAL ACTIVITY Regular physical activity has many positive health benefits. Benefits include strong bones. Weight-bearing physical activity early in life is important in reaching peak bone mass. Weight-bearing physical activities cause muscles and bones to work against gravity. Some examples of weight bearing physical activities include:  Walking, jogging, or running.  [employer].  Jumping rope.  Dancing.  Soccer.  Tennis or Racquetball.  Stair climbing.  Basketball.  Hiking.  Weight lifting.  Aerobic fitness classes. Including weight-bearing physical activity into an exercise plan is a great way to keep bones healthy. Adults: Engage in at least 30 minutes  of moderate physical activity on most, preferably all, days of the week. Children: Engage in at least 60 minutes of moderate physical activity on most, preferably all, days of the week. FOR MORE INFORMATION [place] An[employer]ator, [employer]ographer for [employer] and Promotion:  www.cnpp.usda.gov National Osteoporosis Foundation: [employer] Document Released: 08/29/2003 Document Revised: 10/03/2012 Document Reviewed: 11/28/2008 [facility] Patient Information 2014 [place], [place].

## 2013-05-15 NOTE — Progress Notes (Signed)
Chief Complaint  Patient presents with  . Follow-up    Lab work.    HPI: Patient comes in today with with a couple of things she had her checkup in [place].  Mammogram was considered abnormal but she had one today and was felt to be normal  Her cholesterol was elevated in the 261 LDL 157 HDL 82 but because of family history and elevation she was placed on Lipitor 20 mg. She's been on it since the middle of October without obvious side effects. She's leaving for [place] we'll be back in May.  She also had a bone density which showed a -2.3 in the spine and -1.6 in the hip on the lunar Prodigy vision.  Been having some problems with her right upper Stram M.D. that she calls burning pain mostly at night but about 50% of the day she moves it a certain way some tenderness around the wrist. No obvious weakness she is right-hand-dominant and works with it she'll take Aleve once or twice a day with some help.  Has urinary frequency no burning want to make sure not uti  getls ok today  ROS: See pertinent positives and negatives per HPI.NO cp sob new sx  SE of meds   Bleeding  Past Medical History  Diagnosis Date  . Osteopenia     dexa 98 -1.4 and , -1.1, 99 -1.77 and -1.9, 2010 -1.9 -1.6 on bisphosphonate from 12  2003 to 4/07  . Macular degeneration   . History of retinal hemorrhage   . Hyperlipidemia     Family History  Problem Relation Age of Onset  . Osteoporosis Other   . Colon cancer Other   . Dementia Mother     History   Social History  . Marital Status: Single    Spouse Name: N/A    Number of Children: N/A  . Years of Education: N/A   Social History Main Topics  . Smoking status: Never Smoker   . Smokeless tobacco: None  . Alcohol Use: No  . Drug Use: None  . Sexual Activity: None   Other Topics Concern  . None   Social History Narrative   Single   Back to [facility] for the winter    [facility] of [facility]    No pets   Sept to go to [place]  To visit mom  Dementia for a  month.  Then [facility] area Till April              Outpatient Encounter Prescriptions as of 05/15/2013  Medication Sig  . acyclovir (ZOVIRAX) 5 % ointment Apply topically every 3 (three) hours.    . Ascorbic Acid (VITAMIN C) 100 MG tablet Take 100 mg by mouth daily.  Marland Kitchen atorvastatin (LIPITOR) 20 MG tablet Take 20 mg by mouth daily.  . Bacillus Coagulans-Inulin (PROBIOTIC-PREBIOTIC) 1-250 BILLION-MG CAPS Take by mouth.  . calcium-vitamin D (OSCAL WITH D) 500-200 MG-UNIT per tablet Take 1 tablet by mouth daily.    . Cranberry 125 MG TABS Take by mouth.  . loratadine (CLARITIN) 10 MG tablet Take 10 mg by mouth daily.  . mometasone (NASONEX) 50 MCG/ACT nasal spray Place 2 sprays into the nose daily.    . Omega 3 1200 MG CAPS Take by mouth.  Bertram Gala Glycol-Propyl Glycol (SYSTANE PRESERVATIVE FREE OP) Apply to eye.    Marland Kitchen UNABLE TO FIND Med Name: bill berry  . valACYclovir (VALTREX) 1000 MG tablet Take 1,000 mg by mouth 2 (two) times  daily. 2 bid   . vitamin B-12 (CYANOCOBALAMIN) 1000 MCG tablet Take 1,000 mcg by mouth daily.  . [DISCONTINUED] magnesium oxide (MAG-OX) 400 MG tablet Take 400 mg by mouth daily.      EXAM:  BP 126/70  Pulse 58  Temp(Src) 98.4 F (36.9 C) (Oral)  Wt 129 lb (58.514 kg)  SpO2 97%  Body mass index is 23.59 kg/(m^2).  GENERAL: vitals reviewed and listed above, alert, oriented, appears well hydrated and in no acute distress HEENT: atraumatic, conjunctiva  clear, no obvious abnormalities on inspection of external nose and ears OP : no lesion edema or exudate  NECK: no obvious masses on inspection palpation  LUNGS: clear to auscultation bilaterally, no wheezes, rales or rhonchi, good air movement CV: HRRR, no clubbing cyanosis or  peripheral edema nl cap refill  MS: moves all extremities without noticeable focal  Abnormality rue no atrophy nv seem intact reflexes present  PSYCH: pleasant and cooperative, no obvious depression or anxiety Reviewed  records  ASSESSMENT AND PLAN:  Discussed the following assessment and plan:  Other and unspecified hyperlipidemia - tc 261 ldl 157 3.2 ratio hdl 82 fam hx? placed on atorva  in [place]  Urinary frequency - Plan: POC Urinalysis Dipstick  Osteopenia - prob no change based on review  continue bone health parameters  Numbness and tingling in right hand - ? compression vs tendinitis  disc  alarm features and eval if progressive etc   -Patient advised to return or notify health care team  if symptoms worsen or persist or new concerns arise.  Patient Instructions  I believe the problems with her right upper extremity are mechanical pinched nerves and possibly tendinitis.  If persistent progressive or you get weakness we should have a hand specialist see your arm. Otherwise continue as you're doing. This is not from diabetes  Continue optimization of bone health  Plan check blood work before visit in May cholesterol and chemistry.  Bone Health Our bones do many things. They provide structure, protect organs, anchor muscles, and store calcium. Adequate calcium in your diet and weight-bearing physical activity help build strong bones, improve bone amounts, and may reduce the risk of weakening of bones (osteoporosis) later in life. PEAK BONE MASS By age 64, the average woman has acquired most of her skeletal bone mass. A large decline occurs in older adults which increases the risk of osteoporosis. In women this occurs around the time of menopause. It is important for young girls to reach their peak bone mass in order to maintain bone health throughout life. A person with high bone mass as a young adult will be more likely to have a higher bone mass later in life. Not enough calcium consumption and physical activity early on could result in a failure to achieve optimum bone mass in adulthood. OSTEOPOROSIS Osteoporosis is a disease of the bones. It is defined as low bone mass with deterioration of  bone structure. Osteoporosis leads to an increase risk of fractures with falls. These fractures commonly happen in the wrist, hip, and spine. While men and women of all ages and background can develop osteoporosis, some of the risk factors for osteoporosis are:  Female.  White.  Postmenopausal.  Older adults.  Small in body size.  Eating a diet low in calcium.  Physically inactive.  Smoking.  Use of some medications.  Family history. CALCIUM Calcium is a mineral needed by the body for healthy bones, teeth, and proper function of the heart, muscles,  and nerves. The body cannot produce calcium so it must be absorbed through food. Good sources of calcium include:  Dairy products (low fat or nonfat milk, cheese, and yogurt).  Dark green leafy vegetables (bok choy and broccoli).  Calcium fortified foods (orange juice, cereal, bread, soy beverages, and tofu products).  Nuts (almonds). Recommended amounts of calcium vary for individuals. RECOMMENDED CALCIUM INTAKES Age and Amount in mg per day  Children 1 to 3 years / 700 mg  Children 4 to 8 years / 1,000 mg  Children 9 to 13 years / 1,300 mg  Teens 14 to 18 years / 1,300 mg  Adults 19 to 50 years / 1,000 mg  Adult women 51 to 70 years / 1,200 mg  Adults 71 years and older / 1,200 mg  Pregnant and breastfeeding teens / 1,300 mg  Pregnant and breastfeeding adults / 1,000 mg Vitamin D also plays an important role in healthy bone development. Vitamin D helps in the absorption of calcium. WEIGHT-BEARING PHYSICAL ACTIVITY Regular physical activity has many positive health benefits. Benefits include strong bones. Weight-bearing physical activity early in life is important in reaching peak bone mass. Weight-bearing physical activities cause muscles and bones to work against gravity. Some examples of weight bearing physical activities include:  Walking, jogging, or running.  DIRECT[employer].  Jumping  rope.  Dancing.  Soccer.  Tennis or Racquetball.  Stair climbing.  Basketball.  Hiking.  Weight lifting.  Aerobic fitness classes. Including weight-bearing physical activity into an exercise plan is a great way to keep bones healthy. Adults: Engage in at least 30 minutes of moderate physical activity on most, preferably all, days of the week. Children: Engage in at least 60 minutes of moderate physical activity on most, preferably all, days of the week. FOR MORE INFORMATION [place] [employer], [employer] for [employer] and Promotion: www.cnpp.usda.gov National Osteoporosis Foundation: [employer] Document Released: 08/29/2003 Document Revised: 10/03/2012 Document Reviewed: 11/28/2008 [facility] Patient Information 2014 [place], [place].      Neta Mends. Keyvon Herter M.D.  Total visit > 50% spent counseling review with patient and coordinating care

## 2013-05-17 DIAGNOSIS — R350 Frequency of micturition: Secondary | ICD-10-CM | POA: Insufficient documentation

## 2013-05-17 DIAGNOSIS — R200 Anesthesia of skin: Secondary | ICD-10-CM | POA: Insufficient documentation

## 2013-10-31 ENCOUNTER — Other Ambulatory Visit: Payer: BC Managed Care – PPO

## 2013-11-06 ENCOUNTER — Ambulatory Visit: Payer: BC Managed Care – PPO | Admitting: Internal Medicine

## 2013-11-08 ENCOUNTER — Other Ambulatory Visit (INDEPENDENT_AMBULATORY_CARE_PROVIDER_SITE_OTHER): Payer: BC Managed Care – PPO

## 2013-11-08 DIAGNOSIS — I10 Essential (primary) hypertension: Secondary | ICD-10-CM

## 2013-11-08 DIAGNOSIS — E785 Hyperlipidemia, unspecified: Secondary | ICD-10-CM

## 2013-11-08 LAB — BASIC METABOLIC PANEL
BUN: 15 mg/dL (ref 6–23)
CO2: 28 mEq/L (ref 19–32)
CREATININE: 0.7 mg/dL (ref 0.4–1.2)
Calcium: 9.6 mg/dL (ref 8.4–10.5)
Chloride: 103 mEq/L (ref 96–112)
GFR: 89.31 mL/min (ref >60.00)
GLUCOSE: 79 mg/dL (ref 70–99)
Potassium: 5.1 mEq/L (ref 3.5–5.1)
Sodium: 138 mEq/L (ref 135–145)

## 2013-11-08 LAB — LIPID PANEL
Cholesterol: 244 mg/dL — ABNORMAL HIGH (ref 0–200)
HDL: 81.6 mg/dL (ref >39.00)
LDL Cholesterol: 155 mg/dL — ABNORMAL HIGH (ref 0–99)
TRIGLYCERIDES: 37 mg/dL (ref 0.0–149.0)
Total CHOL/HDL Ratio: 3
VLDL: 7.4 mg/dL (ref 0.0–40.0)

## 2013-11-20 ENCOUNTER — Ambulatory Visit (INDEPENDENT_AMBULATORY_CARE_PROVIDER_SITE_OTHER): Payer: BC Managed Care – PPO | Admitting: Internal Medicine

## 2013-11-20 ENCOUNTER — Encounter: Payer: Self-pay | Admitting: Internal Medicine

## 2013-11-20 VITALS — BP 106/64 | HR 63 | Temp 99.0°F | Ht 62.0 in | Wt 130.0 lb

## 2013-11-20 DIAGNOSIS — R209 Unspecified disturbances of skin sensation: Secondary | ICD-10-CM

## 2013-11-20 DIAGNOSIS — R200 Anesthesia of skin: Secondary | ICD-10-CM

## 2013-11-20 DIAGNOSIS — R202 Paresthesia of skin: Secondary | ICD-10-CM

## 2013-11-20 DIAGNOSIS — E785 Hyperlipidemia, unspecified: Secondary | ICD-10-CM

## 2013-11-20 NOTE — Progress Notes (Signed)
Chief Complaint  Patient presents with  . Follow-up    HPI:  Last visit  Fu  Lipids off lipitor ( given to her in [place]?). Some exercise gets head rush when walk run so doesn't like it as much . No new sx left shoulder tendinitis  And neck issues some better  ROS: See pertinent positives and negatives per HPI.  Past Medical History  Diagnosis Date  . Osteopenia     dexa 98 -1.4 and , -1.1, 99 -1.77 and -1.9, 2010 -1.9 -1.6 on bisphosphonate from 12  2003 to 4/07  . Macular degeneration   . History of retinal hemorrhage   . Hyperlipidemia     Family History  Problem Relation Age of Onset  . Osteoporosis Other   . Colon cancer Other   . Dementia Mother     History   Social History  . Marital Status: Single    Spouse Name: N/A    Number of Children: N/A  . Years of Education: N/A   Social History Main Topics  . Smoking status: Never Smoker   . Smokeless tobacco: None  . Alcohol Use: No  . Drug Use: None  . Sexual Activity: None   Other Topics Concern  . None   Social History Narrative   Single   Back to [facility] for the winter    [facility] of 1    No pets   Sept to go to [place]  To visit mom  Dementia for a month.  Then [facility] area Till April              Outpatient Encounter Prescriptions as of 11/20/2013  Medication Sig  . Ascorbic Acid (VITAMIN C) 100 MG tablet Take 100 mg by mouth daily.  . Bacillus Coagulans-Inulin (PROBIOTIC-PREBIOTIC) 1-250 BILLION-MG CAPS Take by mouth.  . calcium-vitamin D (OSCAL WITH D) 500-200 MG-UNIT per tablet Take 1 tablet by mouth daily.    . Cranberry 125 MG TABS Take by mouth.  . loratadine (CLARITIN) 10 MG tablet Take 10 mg by mouth daily.  . LUTEIN PO Take by mouth.  . naproxen sodium (ANAPROX) 220 MG tablet 220 mg.  . Omega 3 1200 MG CAPS Take by mouth.  Bertram Gala Glycol-Propyl Glycol (SYSTANE PRESERVATIVE FREE OP) Apply to eye.    Marland Kitchen UNABLE TO FIND Med Name: bill berry  . valACYclovir (VALTREX) 1000 MG  tablet Take 1,000 mg by mouth 2 (two) times daily. 2 bid   . acyclovir (ZOVIRAX) 5 % ointment Apply topically every 3 (three) hours.    Marland Kitchen atorvastatin (LIPITOR) 20 MG tablet Take 20 mg by mouth daily.  . mometasone (NASONEX) 50 MCG/ACT nasal spray Place 2 sprays into the nose daily.    . vitamin B-12 (CYANOCOBALAMIN) 1000 MCG tablet Take 1,000 mcg by mouth daily.    EXAM:  BP 106/64  Pulse 63  Temp(Src) 99 F (37.2 C) (Oral)  Ht 5\' 2"  (1.575 m)  Wt 130 lb (58.968 kg)  BMI 23.77 kg/m2  SpO2 98%  Body mass index is 23.77 kg/(m^2).  GENERAL: vitals reviewed and listed above, alert, oriented, appears well hydrated and in no acute distress HEENT: atraumatic, conjunctiva  clear, no obvious abnormalities on inspection of external nose and ears OP : no lesion edema or exudate  NECK: no obvious masses on inspection palpation  LUNGS: clear to auscultation bilaterally, no wheezes, rales or rhonchi, good air movement CV: HRRR, no clubbing cyanosis or  peripheral edema nl cap  refill  MS: moves all extremities without noticeable focal  abnormality PSYCH: pleasant and cooperative, no obvious depression or anxiety Lab Results  Component Value Date   WBC 4.8 01/20/2013   HGB 12.2 01/20/2013   HCT 36.5 01/20/2013   PLT 248.0 01/20/2013   GLUCOSE 79 11/08/2013   CHOL 244* 11/08/2013   TRIG 37.0 11/08/2013   HDL 81.60 11/08/2013   LDLDIRECT 129.8 01/20/2013   LDLCALC 155* 11/08/2013   ALT 12 01/20/2013   AST 16 01/20/2013   NA 138 11/08/2013   K 5.1 11/08/2013   CL 103 11/08/2013   CREATININE 0.7 11/08/2013   BUN 15 11/08/2013   CO2 28 11/08/2013   TSH 1.82 01/20/2013    ASSESSMENT AND PLAN:  Discussed the following assessment and plan:  HYPERLIPIDEMIA - ldl up to 155 but ratio is 3 and reiusk calculator is 1% 10 year risk   Numbness and tingling in right hand - felt to be MS and neck sn shoulder tendinitis  not the lipitor No compelling reason to be on statin medication at this time no other risk factors  and high hdl.  Exercise cross train lsi to continue  Check at pv in fall before leaves for [place] -Patient advised to return or notify health care team  if symptoms worsen ,persist or new concerns arise.  Patient Instructions  Continue lifestyle intervention healthy eating and exercise . 10 cardiovascular risk is 1% which is low .  Preventive visit in September  Before you go out of town. Will check lipids at that time.  I don't think you were having side effects from the lipitor       Sinking SpringWanda K. Lesette Frary M.D.  Pre visit review using our clinic review tool, if applicable. No additional management support is needed unless otherwise documented below in the visit note.

## 2013-11-20 NOTE — Patient Instructions (Addendum)
Continue lifestyle intervention healthy eating and exercise . 10 cardiovascular risk is 1% which is low .  Preventive visit in September  Before you go out of town. Will check lipids at that time.  I don't think you were having side effects from the lipitor

## 2014-03-06 ENCOUNTER — Ambulatory Visit (INDEPENDENT_AMBULATORY_CARE_PROVIDER_SITE_OTHER): Payer: BC Managed Care – PPO | Admitting: Internal Medicine

## 2014-03-06 ENCOUNTER — Telehealth: Payer: Self-pay | Admitting: Internal Medicine

## 2014-03-06 ENCOUNTER — Encounter: Payer: Self-pay | Admitting: Internal Medicine

## 2014-03-06 VITALS — BP 106/70 | Temp 98.8°F | Wt 131.0 lb

## 2014-03-06 DIAGNOSIS — R350 Frequency of micturition: Secondary | ICD-10-CM

## 2014-03-06 DIAGNOSIS — M549 Dorsalgia, unspecified: Secondary | ICD-10-CM

## 2014-03-06 DIAGNOSIS — Z23 Encounter for immunization: Secondary | ICD-10-CM

## 2014-03-06 DIAGNOSIS — M5489 Other dorsalgia: Secondary | ICD-10-CM

## 2014-03-06 DIAGNOSIS — N390 Urinary tract infection, site not specified: Secondary | ICD-10-CM

## 2014-03-06 DIAGNOSIS — R109 Unspecified abdominal pain: Secondary | ICD-10-CM

## 2014-03-06 LAB — POCT URINALYSIS DIPSTICK
BILIRUBIN UA: NEGATIVE
Blood, UA: NEGATIVE
Glucose, UA: NEGATIVE
Ketones, UA: NEGATIVE
NITRITE UA: NEGATIVE
Protein, UA: NEGATIVE
Spec Grav, UA: 1.015
Urobilinogen, UA: 0.2
pH, UA: 7

## 2014-03-06 MED ORDER — CIPROFLOXACIN HCL 500 MG PO TABS: 500.0000 mg | ORAL_TABLET | Freq: Two times a day (BID) | ORAL | Status: DC

## 2014-03-06 NOTE — Patient Instructions (Signed)
Treat for uti  Will let you know when culture is back .    expects should be better  In the next 2-3 days .

## 2014-03-06 NOTE — Telephone Encounter (Signed)
Noted  

## 2014-03-06 NOTE — Progress Notes (Signed)
Pre visit review using our clinic review tool, if applicable. No additional management support is needed unless otherwise documented below in the visit note.  Chief Complaint  Patient presents with  . Abdominal Pain    Started 2 weeks ago.  Denies fever.  . Back Pain    HPI: Patient Paula Salas  comes in today for SDA for  new problem evaluation. about 2 weeks ago  Had odor and then increasing frequency and less volume and then abd cramps. lower   And low back .taking cranberry  .   No fever.  Had uro eval in [place] a few ago and had cysto ? Some tyupe of dilatation   And had months of low dose macrobid.  recent above sx .  ROS: See pertinent positives and negatives per HPI. Toe nail fungus still discolored  Using some type of pain  rx in past going out o twn soon   Past Medical History  Diagnosis Date  . Osteopenia     dexa 98 -1.4 and , -1.1, 99 -1.77 and -1.9, 2010 -1.9 -1.6 on bisphosphonate from 12  2003 to 4/07  . Macular degeneration   . History of retinal hemorrhage   . Hyperlipidemia   . Lower urinary tract symptoms (LUTS)     had uro evaluation  in [place] inc PVR?    Family History  Problem Relation Age of Onset  . Osteoporosis Other   . Colon cancer Other   . Dementia Mother     History   Social History  . Marital Status: Single    Spouse Name: N/A    Number of Children: N/A  . Years of Education: N/A   Social History Main Topics  . Smoking status: Never Smoker   . Smokeless tobacco: None  . Alcohol Use: No  . Drug Use: None  . Sexual Activity: None   Other Topics Concern  . None   Social History Narrative   Single   Back to [facility] for the winter    [facility] of 1    No pets   Sept to go to [place]  To visit mom  Dementia for a month.  Then [facility] area Till April              Outpatient Encounter Prescriptions as of 03/06/2014  Medication Sig  . acyclovir (ZOVIRAX) 5 % ointment Apply topically every 3 (three) hours.    .  Ascorbic Acid (VITAMIN C) 100 MG tablet Take 100 mg by mouth daily.  . Bacillus Coagulans-Inulin (PROBIOTIC-PREBIOTIC) 1-250 BILLION-MG CAPS Take by mouth.  . calcium-vitamin D (OSCAL WITH D) 500-200 MG-UNIT per tablet Take 1 tablet by mouth daily.    . Cranberry 125 MG TABS Take by mouth.  . loratadine (CLARITIN) 10 MG tablet Take 10 mg by mouth daily.  . LUTEIN PO Take by mouth.  . mometasone (NASONEX) 50 MCG/ACT nasal spray Place 2 sprays into the nose daily.    . naproxen sodium (ANAPROX) 220 MG tablet 220 mg.  . Omega 3 1200 MG CAPS Take by mouth.  Bertram Gala Glycol-Propyl Glycol (SYSTANE PRESERVATIVE FREE OP) Apply to eye.    Marland Kitchen UNABLE TO FIND Med Name: bill berry  . valACYclovir (VALTREX) 1000 MG tablet Take 1,000 mg by mouth 2 (two) times daily. 2 bid   . vitamin B-12 (CYANOCOBALAMIN) 1000 MCG tablet Take 1,000 mcg by mouth daily.  . ciprofloxacin (CIPRO) 500 MG tablet Take 1 tablet (500 mg total)  by mouth 2 (two) times daily.    EXAM:  BP 106/70  Temp(Src) 98.8 F (37.1 C) (Oral)  Wt 131 lb (59.421 kg)  Body mass index is 23.95 kg/(m^2).  GENERAL: vitals reviewed and listed above, alert, oriented, appears well hydrated and in no acute distress HEENT: atraumatic, conjunctiva  clear, no obvious abnormalities on inspection of external nose and ears  NECK: no obvious masses on inspection palpation  CV: HRRR, no clubbing cyanosis or  peripheral edema nl cap refill  Abdomen:  Sof,t normal bowel sounds without hepatosplenomegaly, no guarding rebound or masses no CVA tenderness Points to RLQ area of tenderness MS: moves all extremities without noticeable focal  abnormality PSYCH: pleasant and cooperative, no obvious depression or anxiety  ASSESSMENT AND PLAN:  Discussed the following assessment and plan:  Urinary frequency  Other back pain - Plan: POC Urinalysis Dipstick, Urine culture  Abdominal pain, unspecified site - Plan: POC Urinalysis Dipstick, Urine culture  Need  for prophylactic vaccination and inoculation against influenza - Plan: Flu Vaccine QUAD 36+ mos PF IM (Fluarix Quad PF)  Urinary tract infection, site not specified prob uti hx of lower urinary dysfunction  May fu when goes to  [place]  Check on shingles vaccine  For wellness visit upcoming. -Patient advised to return or notify health care team  if symptoms worsen ,persist or new concerns arise.  Patient Instructions  Treat for uti  Will let you know when culture is back .    expects should be better  In the next 2-3 days .    Neta Mends. Panosh M.D.

## 2014-03-06 NOTE — Telephone Encounter (Signed)
Patient Information:  Caller Name: Tanetta  Phone: [phone]  Patient: Paula Salas  Gender: Female  DOB: May 26, 1956  Age: 58 Years  PCP: Berniece Andreas (Family Practice)  Office Follow Up:  Does the office need to follow up with this patient?: No  Instructions For The Office: N/A   Symptoms  Reason For Call & Symptoms: Pt calling regarding possible UTI sx. Urgency and flank pain. Urine color is good.  Reviewed Health History In EMR: Yes  Reviewed Medications In EMR: Yes  Reviewed Allergies In EMR: Yes  Reviewed Surgeries / Procedures: Yes  Date of Onset of Symptoms: 02/21/2014  Treatments Tried: Cranberry and increased fluids.  Treatments Tried Worked: No  Guideline(s) Used:  Urination Pain - Female  Disposition Per Guideline:   Go to Office Now  Reason For Disposition Reached:   Side (flank) or lower back pain present  Advice Given:  N/A  Patient Will Follow Care Advice:  YES  Appointment Scheduled:  03/06/2014 15:45:00 Appointment Scheduled Provider:  Berniece Andreas (Family Practice)

## 2014-03-08 LAB — URINE CULTURE
COLONY COUNT: NO GROWTH
ORGANISM ID, BACTERIA: NO GROWTH

## 2014-03-20 ENCOUNTER — Other Ambulatory Visit: Payer: BC Managed Care – PPO

## 2014-03-21 ENCOUNTER — Encounter: Payer: Self-pay | Admitting: Family Medicine

## 2014-03-22 ENCOUNTER — Other Ambulatory Visit: Payer: BC Managed Care – PPO

## 2014-03-23 ENCOUNTER — Other Ambulatory Visit (INDEPENDENT_AMBULATORY_CARE_PROVIDER_SITE_OTHER): Payer: BC Managed Care – PPO

## 2014-03-23 DIAGNOSIS — Z0000 Encounter for general adult medical examination without abnormal findings: Secondary | ICD-10-CM

## 2014-03-23 LAB — BASIC METABOLIC PANEL
BUN: 18 mg/dL (ref 6–23)
CHLORIDE: 107 meq/L (ref 96–112)
CO2: 29 mEq/L (ref 19–32)
Calcium: 9.6 mg/dL (ref 8.4–10.5)
Creatinine, Ser: 0.6 mg/dL (ref 0.4–1.2)
GFR: 104.3 mL/min (ref >60.00)
Glucose, Bld: 91 mg/dL (ref 70–99)
POTASSIUM: 4.7 meq/L (ref 3.5–5.1)
Sodium: 145 mEq/L (ref 135–145)

## 2014-03-23 LAB — HEPATIC FUNCTION PANEL
ALT: 10 U/L (ref 0–35)
AST: 23 U/L (ref 0–37)
Albumin: 4.3 g/dL (ref 3.5–5.2)
Alkaline Phosphatase: 66 U/L (ref 39–117)
BILIRUBIN DIRECT: 0 mg/dL (ref 0.0–0.3)
BILIRUBIN TOTAL: 0.6 mg/dL (ref 0.2–1.2)
Total Protein: 7.2 g/dL (ref 6.0–8.3)

## 2014-03-23 LAB — CBC WITH DIFFERENTIAL/PLATELET
BASOS ABS: 0 10*3/uL (ref 0.0–0.1)
Basophils Relative: 0.7 % (ref 0.0–3.0)
Eosinophils Absolute: 0.1 10*3/uL (ref 0.0–0.7)
Eosinophils Relative: 2.9 % (ref 0.0–5.0)
HCT: 37.2 % (ref 36.0–46.0)
Hemoglobin: 12.3 g/dL (ref 12.0–15.0)
LYMPHS PCT: 43.7 % (ref 12.0–46.0)
Lymphs Abs: 1.7 10*3/uL (ref 0.7–4.0)
MCHC: 33.2 g/dL (ref 30.0–36.0)
MCV: 93.3 fl (ref 78.0–100.0)
MONO ABS: 0.5 10*3/uL (ref 0.1–1.0)
Monocytes Relative: 12.2 % — ABNORMAL HIGH (ref 3.0–12.0)
NEUTROS PCT: 40.5 % — AB (ref 43.0–77.0)
Neutro Abs: 1.6 10*3/uL (ref 1.4–7.7)
Platelets: 190 10*3/uL (ref 150.0–400.0)
RBC: 3.98 Mil/uL (ref 3.87–5.11)
RDW: 13.6 % (ref 11.5–15.5)
WBC: 4 10*3/uL (ref 4.0–10.5)

## 2014-03-23 LAB — TSH: TSH: 1.43 u[IU]/mL (ref 0.35–4.50)

## 2014-03-23 LAB — LIPID PANEL
CHOLESTEROL: 250 mg/dL — AB (ref 0–200)
HDL: 82.3 mg/dL (ref >39.00)
LDL Cholesterol: 153 mg/dL — ABNORMAL HIGH (ref 0–99)
NonHDL: 167.7
Total CHOL/HDL Ratio: 3
Triglycerides: 72 mg/dL (ref 0.0–149.0)
VLDL: 14.4 mg/dL (ref 0.0–40.0)

## 2014-03-26 ENCOUNTER — Telehealth: Payer: Self-pay | Admitting: Internal Medicine

## 2014-03-26 NOTE — Telephone Encounter (Signed)
Pt notified of results

## 2014-03-26 NOTE — Telephone Encounter (Signed)
Pt had to cancell cpx tomorrow going out of country. Pt would like cpx labs results

## 2014-03-26 NOTE — Telephone Encounter (Signed)
Please give her results  Normal except elevated cholesterol .

## 2014-03-27 ENCOUNTER — Encounter: Payer: BC Managed Care – PPO | Admitting: Internal Medicine

## 2014-04-23 ENCOUNTER — Telehealth: Payer: Self-pay | Admitting: Internal Medicine

## 2014-04-23 NOTE — Telephone Encounter (Signed)
1:30 on Wednesday?

## 2014-04-23 NOTE — Telephone Encounter (Signed)
Pt called and said she is only this week before she has to go back out of the country. She is asking if Dr Fabian SharpPanosh can fit her in this week for a physical ..

## 2014-04-23 NOTE — Telephone Encounter (Signed)
Pt has been scheduled.  °

## 2014-04-25 ENCOUNTER — Encounter: Payer: Self-pay | Admitting: Internal Medicine

## 2014-04-25 ENCOUNTER — Ambulatory Visit (INDEPENDENT_AMBULATORY_CARE_PROVIDER_SITE_OTHER): Payer: BC Managed Care – PPO | Admitting: Internal Medicine

## 2014-04-25 VITALS — BP 106/66 | Temp 98.1°F | Ht 62.0 in | Wt 133.0 lb

## 2014-04-25 DIAGNOSIS — E785 Hyperlipidemia, unspecified: Secondary | ICD-10-CM

## 2014-04-25 DIAGNOSIS — N952 Postmenopausal atrophic vaginitis: Secondary | ICD-10-CM

## 2014-04-25 DIAGNOSIS — Z23 Encounter for immunization: Secondary | ICD-10-CM

## 2014-04-25 DIAGNOSIS — M8580 Other specified disorders of bone density and structure, unspecified site: Secondary | ICD-10-CM

## 2014-04-25 DIAGNOSIS — H3530 Unspecified macular degeneration: Secondary | ICD-10-CM

## 2014-04-25 DIAGNOSIS — Z0000 Encounter for general adult medical examination without abnormal findings: Secondary | ICD-10-CM

## 2014-04-25 DIAGNOSIS — N319 Neuromuscular dysfunction of bladder, unspecified: Secondary | ICD-10-CM

## 2014-04-25 LAB — HM MAMMOGRAPHY

## 2014-04-25 MED ORDER — ESTRADIOL 10 MCG VA TABS: 1.0000 | ORAL_TABLET | VAGINAL | Status: DC

## 2014-04-25 NOTE — Progress Notes (Signed)
Chief Complaint  Patient presents with  . Follow-up    annual exam and eval in [place]    HPI: Patient comes in today to reviewe Preventive Health Care Had exam in [place]  Urology :seen and told to do exercised to train bladder cause of some retention sx pvr 40 dec to 15% Also had dexa osteopenia -2.4 and -1.7 Gyne: and uro vaginal dryness and advised estrogen ovules q month? very expensive  ? If otc or other  Mom dementia declining in  Col no fracture despite fall.  Health Maintenance  Topic Date Due  . INFLUENZA VACCINE  01/21/2015  . MAMMOGRAM  05/16/2015  . PAP SMEAR  05/15/2016  . COLONOSCOPY  04/10/2017  . TETANUS/TDAP  05/09/2019  . ZOSTAVAX  Completed   Health Maintenance Review  ROS:  GEN/ HEENT: No fever, significant weight changes sweats headaches change in  vision problems has mac degeneration hearing changes, CV/ PULM; No chest pain shortness of breath cough, syncope,edema  change in exercise tolerance. GI /GU: No adominal pain, vomiting, change in bowel habits. No blood in the stool. No significant GU symptoms. SKIN/HEME: ,no acute skin rashes suspicious lesions or bleeding. No lymphadenopathy, nodules, masses.  NEURO/ PSYCH:  No neurologic signs such as weakness numbness. No depression anxiety. IMM/ Allergy: No unusual infections.  Allergy .   REST of 12 system review negative except as per HPI   Past Medical History  Diagnosis Date  . Osteopenia     dexa 98 -1.4 and , -1.1, 99 -1.77 and -1.9, 2010 -1.9 -1.6 on bisphosphonate from 12  2003 to 4/07  . Macular degeneration   . History of retinal hemorrhage   . Hyperlipidemia   . Lower urinary tract symptoms (LUTS)     had [place]ro [place] PVR?    Family History  Problem Relation Age of Onset  . Osteoporosis Other   . Colon cancer Other   . Dementia Mother     History   Social History  . Marital Status: Single    Spouse Name: N/A    Number of Children: N/A  . Years of  Education: N/A   Social History Main Topics  . Smoking status: Never Smoker   . Smokeless tobacco: Never Used  . Alcohol Use: No  . Drug Use: None  . Sexual Activity: None   Other Topics Concern  . None   Social History Narrative   Single   [facility]    [place] [facility]   [place]  Dementia [place] month.  [place] [facility]  [place]              Outpatient Encounter Prescriptions as of 04/25/2014  Medication Sig  . Ascorbic Acid (VITAMIN C) 100 MG tablet Take 100 mg by mouth daily.  . calcium-vitamin D (OSCAL WITH D) 500-200 MG-UNIT per tablet Take 1 tablet by mouth daily.    . Cranberry 125 MG TABS Take by mouth.  . loratadine (CLARITIN) 10 MG tablet Take 10 mg by mouth daily.  . LUTEIN PO Take by mouth.  . mometasone (NASONEX) 50 MCG/ACT nasal spray Place 2 sprays into the nose daily.    . naproxen sodium (ANAPROX) 220 MG tablet 220 mg.  . Omega 3 1200 MG CAPS Take by mouth.  Bertram Gala. Polyethyl Glycol-Propyl Glycol (SYSTANE PRESERVATIVE FREE OP) Apply to eye.    .Marland Kitchen  valACYclovir (VALTREX) 1000 MG tablet Take 1,000 mg by mouth 2 (two) times daily. 2 bid   . vitamin B-12 (CYANOCOBALAMIN) 1000 MCG tablet Take 1,000 mcg by mouth daily.  Marland Kitchen acyclovir (ZOVIRAX) 5 % ointment Apply topically every 3 (three) hours.    . Estradiol 10 MCG TABS vaginal tablet Place 1 tablet (10 mcg total) vaginally as directed.  . [DISCONTINUED] Bacillus Coagulans-Inulin (PROBIOTIC-PREBIOTIC) 1-250 BILLION-MG CAPS Take by mouth.  . [DISCONTINUED] ciprofloxacin (CIPRO) 500 MG tablet Take 1 tablet (500 mg total) by mouth 2 (two) times daily.  . [DISCONTINUED] UNABLE TO FIND Med Name: bill berry    EXAM:  BP 106/66 mmHg  Temp(Src) 98.1 F (36.7 C) (Oral)  Ht 5\' 2"  (1.575 m)  Wt 133 lb (60.328 kg)  BMI 24.32 kg/m2  Body mass index is 24.32 kg/(m^2).  Physical Exam: Vital signs reviewed EXB:MWUX is a well-developed well-nourished alert cooperative     who appearsr stated age in no acute distress.  HEENT: normocephalic atraumatic ,PSYCH: Oriented, good eye contact, no obvious depression anxiety, cognition and judgment appear normal. LN: no cervical adenopathy  Lab Results  Component Value Date   WBC 4.0 03/23/2014   HGB 12.3 03/23/2014   HCT 37.2 03/23/2014   PLT 190.0 03/23/2014   GLUCOSE 91 03/23/2014   CHOL 250* 03/23/2014   TRIG 72.0 03/23/2014   HDL 82.30 03/23/2014   LDLDIRECT 129.8 01/20/2013   LDLCALC 153* 03/23/2014   ALT 10 03/23/2014   AST 23 03/23/2014   NA 145 03/23/2014   K 4.7 03/23/2014   CL 107 03/23/2014   CREATININE 0.6 03/23/2014   BUN 18 03/23/2014   CO2 29 03/23/2014   TSH 1.43 03/23/2014   Blood tests data  reviewed from [place] [facility] n[facility] low iron . ASSESSMENT AND PLAN:  Discussed the following assessment and plan:  Osteopenia - disc  risk benefit -2.4 sp -1.7 hip  Hyperlipidemia - lsi ok  ? if had se of meds feels good now  Bladder dysfunction - had cysto uro checks in [place]   Atrophic vaginitis - try estradiol supp  as per urology   Macular degeneration disease  Preventative health care - Plan: Varicella-zoster vaccine subcutaneous  Need for shingles vaccine - Plan: Varicella-zoster vaccine subcutaneous Hx of se of statin?  Patient Care Team: [name], MD as PCP - General Edmon Crape, MD as Consulting Physician (Ophthalmology) Patient Instructions  Continue  Weight bearing exercise  . Should repeat DEXA  In 2 years or as needed.  Consideration of adding medication if needed.  Can try  Vaginal estrogen there are many types  creams  And suppositories .   May consider prolia if needed in future if osteoporosis    Cholesterol is high  lifestyle   Intervention.  Healthy lifestyle includes : At least 150 minutes of exercise weeks  , weight at healthy levels, which is usually   BMI 19-25. Avoid trans fats and processed foods;  Increase fresh fruits and veges to 5 servings  per day. And avoid sweet beverages including tea and juice. Mediterranean diet with olive oil and nuts have been noted to be heart and brain healthy . Avoid tobacco products . Limit  alcohol to  7 per week for women and 14 servings for men.  Get adequate sleep . Wear seat belts . Don't text and drive .     Neta Mends. Jerold Yoss M.D.   Total visit > 50% spent counseling  Prevention/ bone health  and coordinating care

## 2014-04-25 NOTE — Patient Instructions (Addendum)
Continue  Weight bearing exercise  . Should repeat DEXA  In 2 years or as needed.  Consideration of adding medication if needed.  Can try  Vaginal estrogen there are many types  creams  And suppositories .   May consider prolia if needed in future if osteoporosis    Cholesterol is high  lifestyle   Intervention.  Healthy lifestyle includes : At least 150 minutes of exercise weeks  , weight at healthy levels, which is usually   BMI 19-25. Avoid trans fats and processed foods;  Increase fresh fruits and veges to 5 servings per day. And avoid sweet beverages including tea and juice. Mediterranean diet with olive oil and nuts have been noted to be heart and brain healthy . Avoid tobacco products . Limit  alcohol to  7 per week for women and 14 servings for men.  Get adequate sleep . Wear seat belts . Don't text and drive .

## 2014-04-26 ENCOUNTER — Encounter: Payer: Self-pay | Admitting: Internal Medicine

## 2014-05-01 ENCOUNTER — Encounter: Payer: Self-pay | Admitting: Family Medicine

## 2014-05-16 ENCOUNTER — Encounter: Payer: Self-pay | Admitting: Internal Medicine

## 2015-02-14 ENCOUNTER — Telehealth (HOSPITAL_COMMUNITY): Payer: Self-pay | Admitting: *Deleted

## 2015-02-14 ENCOUNTER — Encounter: Payer: Self-pay | Admitting: Internal Medicine

## 2015-02-14 ENCOUNTER — Ambulatory Visit (INDEPENDENT_AMBULATORY_CARE_PROVIDER_SITE_OTHER): Payer: BLUE CROSS/BLUE SHIELD | Admitting: Internal Medicine

## 2015-02-14 VITALS — BP 114/68 | Temp 98.3°F | Wt 132.5 lb

## 2015-02-14 DIAGNOSIS — N952 Postmenopausal atrophic vaginitis: Secondary | ICD-10-CM

## 2015-02-14 DIAGNOSIS — R300 Dysuria: Secondary | ICD-10-CM | POA: Diagnosis not present

## 2015-02-14 DIAGNOSIS — Z79899 Other long term (current) drug therapy: Secondary | ICD-10-CM

## 2015-02-14 DIAGNOSIS — N319 Neuromuscular dysfunction of bladder, unspecified: Secondary | ICD-10-CM | POA: Diagnosis not present

## 2015-02-14 DIAGNOSIS — R079 Chest pain, unspecified: Secondary | ICD-10-CM | POA: Diagnosis not present

## 2015-02-14 DIAGNOSIS — G4700 Insomnia, unspecified: Secondary | ICD-10-CM | POA: Diagnosis not present

## 2015-02-14 DIAGNOSIS — E2839 Other primary ovarian failure: Secondary | ICD-10-CM

## 2015-02-14 LAB — POCT URINALYSIS DIPSTICK
Bilirubin, UA: NEGATIVE
Glucose, UA: NEGATIVE
Ketones, UA: NEGATIVE
Leukocytes, UA: NEGATIVE
NITRITE UA: NEGATIVE
PH UA: 6
PROTEIN UA: NEGATIVE
RBC UA: NEGATIVE
UROBILINOGEN UA: 0.2

## 2015-02-14 MED ORDER — ESTRADIOL 10 MCG VA TABS: 1.0000 | ORAL_TABLET | VAGINAL | Status: AC

## 2015-02-14 MED ORDER — CIPROFLOXACIN HCL 500 MG PO TABS: 500.0000 mg | ORAL_TABLET | Freq: Two times a day (BID) | ORAL | Status: DC

## 2015-02-14 NOTE — Progress Notes (Signed)
Pre visit review using our clinic review tool, if applicable. No additional management support is needed unless otherwise documented below in the visit note.  Chief Complaint  Patient presents with  . Follow-up    urinary problem meds  other     HPI: Paula Salas 59 y.o.     comes in for a number of issues today.  She usually goes to [place] and has a urinary check and gets urethral dilatation. She has noticed decrease in her stream recently but may not get back until November. No current UTI symptoms full-blown but is told to take Cipro as needed. She shows a box of medication given by the urologist which is Cipro 500 twice a day #10. She also is supposed to be on asked her GEN estradiol 3.5 ovules vaginal tablets every month every other week to help with the urinary lining especially before procedures. Asks if we have something equivalent.  Has a lot of stress working going out of town this weekend. Problems with insomnia and possibly mood but she thinks she knows how to handle it.  Has gotten some chest discomfort pain mid to right chest went hiking up grandfather [place]. She had to stop and rest and went away but it happened a couple times. Otherwise no change in exercise tolerance and no cardiovascular symptoms.  She has a history of cholesterol elevation usually gets it checked in the fall. No diagnosis of heart disease.  ROS: See pertinent positives and negatives per HPI. See history of present illness  Past Medical History  Diagnosis Date  . Osteopenia     dexa 98 -1.4 and , -1.1, 99 -1.77 and -1.9, 2010 -1.9 -1.6 on bisphosphonate from 12  2003 to 4/07  . Macular degeneration   . History of retinal hemorrhage   . Hyperlipidemia   . Lower urinary tract symptoms (LUTS)     had uro evaluation  in [place] PVR?    Family History  Problem Relation Age of Onset  . Osteoporosis Other   . Colon cancer Other   . Dementia Mother     Social History   Social History   . Marital Status: Single    Spouse Name: N/A  . Number of Children: N/A  . Years of Education: N/A   Social History Main Topics  . Smoking status: Never Smoker   . Smokeless tobacco: Never Used  . Alcohol Use: No  . Drug Use: None  . Sexual Activity: Not Asked   Other Topics Concern  . None   Social History Narrative   Single   Back to [facility]    [facility]    No pets   Sept  [place]  Dementia for a month.  Then [facility] area Till April              Outpatient Prescriptions Prior to Visit  Medication Sig Dispense Refill  . acyclovir (ZOVIRAX) 5 % ointment Apply topically every 3 (three) hours.      . Ascorbic Acid (VITAMIN C) 100 MG tablet Take 100 mg by mouth daily.    . calcium-vitamin D (OSCAL WITH D) 500-200 MG-UNIT per tablet Take 1 tablet by mouth daily.      . Cranberry 125 MG TABS Take by mouth.    . loratadine (CLARITIN) 10 MG tablet Take 10 mg by mouth daily.    . mometasone (NASONEX) 50 MCG/ACT nasal spray Place 2 sprays into the  nose daily.      . naproxen sodium (ANAPROX) 220 MG tablet 220 mg.    . Omega 3 1200 MG CAPS Take by mouth.    Bertram Gala Glycol-Propyl Glycol (SYSTANE PRESERVATIVE FREE OP) Apply to eye.      . valACYclovir (VALTREX) 1000 MG tablet Take 1,000 mg by mouth 2 (two) times daily. 2 bid     . Estradiol 10 MCG TABS vaginal tablet Place 1 tablet (10 mcg total) vaginally as directed. 8 tablet 3  . LUTEIN PO Take by mouth.    . vitamin B-12 (CYANOCOBALAMIN) 1000 MCG tablet Take 1,000 mcg by mouth daily.     No facility-administered medications prior to visit.     EXAM:  BP 114/68 mmHg  Temp(Src) 98.3 F (36.8 C) (Oral)  Wt 132 lb 8 oz (60.102 kg)  Body mass index is 24.23 kg/(m^2).  GENERAL: vitals reviewed and listed above, alert, oriented, appears well hydrated and in no acute distress HEENT: atraumatic, conjunctiva  clear, no obvious abnormalities on inspection of external nose and ears NECK: no obvious  masses on inspection palpation  LUNGS: clear to auscultation bilaterally, no wheezes, rales or rhonchi, good air movement CV: HRRR, no clubbing cyanosis or  peripheral edema nl cap refill  abdomen soft without megaly guarding or rebound and no bruits are noted. MS: moves all extremities without noticeable focal  abnormality PSYCH: pleasant and cooperative, no obvious depression or anxiety Lab Results  Component Value Date   WBC 4.0 03/23/2014   HGB 12.3 03/23/2014   HCT 37.2 03/23/2014   PLT 190.0 03/23/2014   GLUCOSE 91 03/23/2014   CHOL 250* 03/23/2014   TRIG 72.0 03/23/2014   HDL 82.30 03/23/2014   LDLDIRECT 129.8 01/20/2013   LDLCALC 153* 03/23/2014   ALT 10 03/23/2014   AST 23 03/23/2014   NA 145 03/23/2014   K 4.7 03/23/2014   CL 107 03/23/2014   CREATININE 0.6 03/23/2014   BUN 18 03/23/2014   CO2 29 03/23/2014   TSH 1.43 03/23/2014   EKG shows normal sinus rhythm diffuse low voltage may be artifact. No acute findings. ASSESSMENT AND PLAN:  Discussed the following assessment and plan:  Bladder dysfunction  Dysuria - Plan: POC Urinalysis Dipstick  Chest pain on exertion - up  grandfather [place] on right  mild  no exercise intolerance quickly better with rest .     - Plan: EKG 12-Lead, Exercise Tolerance Test  Insomnia  Estrogen deficiency  Medication management  Atrophic vaginitis Health Maintenance Due  Topic Date Due  . Hepatitis C Screening  [birth year]  . HIV Screening  [birth year]/1969  . INFLUENZA VACCINE  01/21/2015    -Patient advised to return or notify health care team  if symptoms worsen ,persist or new concerns arise.  Patient Instructions  Estradiol tabs   As per your urologist .  only use the cipro if getting uti sx or as per urologist .     Urine is lear today .   Consider getting a stress test   Exercise  9 we cna order cause of [place]   however if  persistent or progressive need to see cardiology    Insomnia Insomnia is frequent trouble falling and/or staying asleep. Insomnia can be a long term problem or a short term problem. Both are common. Insomnia can be a short term problem when the wakefulness is related to a certain stress or worry. Long term insomnia is often related  to ongoing stress during waking hours and/or poor sleeping habits. Overtime, sleep deprivation itself can make the problem worse. Every little thing feels more severe because you are overtired and your ability to cope is decreased. CAUSES   Stress, anxiety, and depression.  Poor sleeping habits.  Distractions such as TV in the bedroom.  Naps close to bedtime.  Engaging in emotionally charged conversations before bed.  Technical reading before sleep.  Alcohol and other sedatives. They may make the problem worse. They can hurt normal sleep patterns and normal dream activity.  Stimulants such as caffeine for several hours prior to bedtime.  Pain syndromes and shortness of breath can cause insomnia.  Exercise late at night.  Changing time zones may cause sleeping problems (jet lag). It is sometimes helpful to have someone observe your sleeping patterns. They should look for periods of not breathing during the night (sleep apnea). They should also look to see how long those periods last. If you live alone or observers are uncertain, you can also be observed at a sleep clinic where your sleep patterns will be professionally monitored. Sleep apnea requires a checkup and treatment. Give your caregivers your medical history. Give your caregivers observations your family has made about your sleep.  SYMPTOMS   Not feeling rested in the morning.  Anxiety and restlessness at bedtime.  Difficulty falling and staying asleep. TREATMENT   Your caregiver may prescribe treatment for an underlying medical disorders. Your caregiver can give advice or help if you are using alcohol or other drugs for self-medication.  Treatment of underlying problems will usually eliminate insomnia problems.  Medications can be prescribed for short time use. They are generally not recommended for lengthy use.  Over-the-counter sleep medicines are not recommended for lengthy use. They can be habit forming.  You can promote easier sleeping by making lifestyle changes such as:  Using relaxation techniques that help with breathing and reduce muscle tension.  Exercising earlier in the day.  Changing your diet and the time of your last meal. No night time snacks.  Establish a regular time to go to bed.  Counseling can help with stressful problems and worry.  Soothing music and white noise may be helpful if there are background noises you cannot remove.  Stop tedious detailed work at least one hour before bedtime. HOME CARE INSTRUCTIONS   Keep a diary. Inform your caregiver about your progress. This includes any medication side effects. See your caregiver regularly. Take note of:  Times when you are asleep.  Times when you are awake during the night.  The quality of your sleep.  How you feel the next day. This information will help your caregiver care for you.  Get out of bed if you are still awake after 15 minutes. Read or do some quiet activity. Keep the lights down. Wait until you feel sleepy and go back to bed.  Keep regular sleeping and waking hours. Avoid naps.  Exercise regularly.  Avoid distractions at bedtime. Distractions include watching television or engaging in any intense or detailed activity like attempting to balance the household checkbook.  Develop a bedtime ritual. Keep a familiar routine of bathing, brushing your teeth, climbing into bed at the same time each night, listening to soothing music. Routines increase the success of falling to sleep faster.  Use relaxation techniques. This can be using breathing and muscle tension release routines. It can also include visualizing peaceful scenes.  You can also help control troubling or intruding thoughts by  keeping your mind occupied with boring or repetitive thoughts like the old concept of counting sheep. You can make it more creative like imagining planting one beautiful flower after another in your backyard garden.  During your day, work to eliminate stress. When this is not possible use some of the previous suggestions to help reduce the anxiety that accompanies stressful situations. MAKE SURE YOU:   Understand these instructions.  Will watch your condition.  Will get help right away if you are not doing well or get worse. Document Released: 06/05/2000 Document Revised: 08/31/2011 Document Reviewed: 07/06/2007 [facility] Patient Information 2015 [place], [place]. This information is not intended to replace advice given to you by your health care provider. Make sure you discuss any questions you have with your health care provider.      Neta Mends. Loza Prell M.D.

## 2015-02-14 NOTE — Patient Instructions (Addendum)
Estradiol tabs   As per your urologist .  only use the cipro if getting uti sx or as per urologist .     Urine is lear today .   Consider getting a stress test   Exercise  9 we cna order cause of [place]   however if  persistent or progressive need to see cardiology   Insomnia Insomnia is frequent trouble falling and/or staying asleep. Insomnia can be a long term problem or a short term problem. Both are common. Insomnia can be a short term problem when the wakefulness is related to a certain stress or worry. Long term insomnia is often related to ongoing stress during waking hours and/or poor sleeping habits. Overtime, sleep deprivation itself can make the problem worse. Every little thing feels more severe because you are overtired and your ability to cope is decreased. CAUSES   Stress, anxiety, and depression.  Poor sleeping habits.  Distractions such as TV in the bedroom.  Naps close to bedtime.  Engaging in emotionally charged conversations before bed.  Technical reading before sleep.  Alcohol and other sedatives. They may make the problem worse. They can hurt normal sleep patterns and normal dream activity.  Stimulants such as caffeine for several hours prior to bedtime.  Pain syndromes and shortness of breath can cause insomnia.  Exercise late at night.  Changing time zones may cause sleeping problems (jet lag). It is sometimes helpful to have someone observe your sleeping patterns. They should look for periods of not breathing during the night (sleep apnea). They should also look to see how long those periods last. If you live alone or observers are uncertain, you can also be observed at a sleep clinic where your sleep patterns will be professionally monitored. Sleep apnea requires a checkup and treatment. Give your caregivers your medical history. Give your caregivers observations your family has made about your sleep.  SYMPTOMS   Not feeling  rested in the morning.  Anxiety and restlessness at bedtime.  Difficulty falling and staying asleep. TREATMENT   Your caregiver may prescribe treatment for an underlying medical disorders. Your caregiver can give advice or help if you are using alcohol or other drugs for self-medication. Treatment of underlying problems will usually eliminate insomnia problems.  Medications can be prescribed for short time use. They are generally not recommended for lengthy use.  Over-the-counter sleep medicines are not recommended for lengthy use. They can be habit forming.  You can promote easier sleeping by making lifestyle changes such as:  Using relaxation techniques that help with breathing and reduce muscle tension.  Exercising earlier in the day.  Changing your diet and the time of your last meal. No night time snacks.  Establish a regular time to go to bed.  Counseling can help with stressful problems and worry.  Soothing music and white noise may be helpful if there are background noises you cannot remove.  Stop tedious detailed work at least one hour before bedtime. HOME CARE INSTRUCTIONS   Keep a diary. Inform your caregiver about your progress. This includes any medication side effects. See your caregiver regularly. Take note of:  Times when you are asleep.  Times when you are awake during the night.  The quality of your sleep.  How you feel the next day. This information will help your caregiver care for you.  Get out of bed if you are still awake after 15 minutes. Read or do some quiet activity.  Keep the lights down. Wait until you feel sleepy and go back to bed.  Keep regular sleeping and waking hours. Avoid naps.  Exercise regularly.  Avoid distractions at bedtime. Distractions include watching television or engaging in any intense or detailed activity like attempting to balance the household checkbook.  Develop a bedtime ritual. Keep a familiar routine of bathing,  brushing your teeth, climbing into bed at the same time each night, listening to soothing music. Routines increase the success of falling to sleep faster.  Use relaxation techniques. This can be using breathing and muscle tension release routines. It can also include visualizing peaceful scenes. You can also help control troubling or intruding thoughts by keeping your mind occupied with boring or repetitive thoughts like the old concept of counting sheep. You can make it more creative like imagining planting one beautiful flower after another in your backyard garden.  During your day, work to eliminate stress. When this is not possible use some of the previous suggestions to help reduce the anxiety that accompanies stressful situations. MAKE SURE YOU:   Understand these instructions.  Will watch your condition.  Will get help right away if you are not doing well or get worse. Document Released: 06/05/2000 Document Revised: 08/31/2011 Document Reviewed: 07/06/2007 [facility] Patient Information 2015 [place], [place]. This information is not intended to replace advice given to you by your health care provider. Make sure you discuss any questions you have with your health care provider.

## 2015-03-07 ENCOUNTER — Inpatient Hospital Stay (HOSPITAL_COMMUNITY): Admission: RE | Admit: 2015-03-07 | Payer: BLUE CROSS/BLUE SHIELD | Source: Ambulatory Visit

## 2015-03-22 ENCOUNTER — Telehealth (HOSPITAL_COMMUNITY): Payer: Self-pay

## 2015-03-22 NOTE — Telephone Encounter (Signed)
Encounter complete. 

## 2015-03-27 ENCOUNTER — Encounter (HOSPITAL_COMMUNITY): Payer: Self-pay | Admitting: *Deleted

## 2015-03-27 ENCOUNTER — Ambulatory Visit (HOSPITAL_COMMUNITY)
Admission: RE | Admit: 2015-03-27 | Discharge: 2015-03-27 | Disposition: A | Discharge status: Home / Self Care | Payer: BLUE CROSS/BLUE SHIELD | Source: Ambulatory Visit | Attending: Internal Medicine | Admitting: Internal Medicine

## 2015-03-27 DIAGNOSIS — R079 Chest pain, unspecified: Secondary | ICD-10-CM

## 2015-03-27 DIAGNOSIS — R9439 Abnormal result of other cardiovascular function study: Secondary | ICD-10-CM | POA: Insufficient documentation

## 2015-03-27 NOTE — Progress Notes (Unsigned)
Patient ID: Paula Salas, female   DOB: 1955/08/11, 59 y.o.   MRN: [id number] Patient had an ETT with approximately 2.5 mm ST depression. Test was showed to Dr. Tresa Endo and he ok'd patient to be d/c home.  Dr. Tresa Endo stated he would read the test.

## 2015-03-28 LAB — EXERCISE TOLERANCE TEST
CHL CUP STRESS STAGE 1 GRADE: 0 %
CHL CUP STRESS STAGE 1 HR: 82 {beats}/min
CHL CUP STRESS STAGE 1 SBP: 111 mmHg
CHL CUP STRESS STAGE 1 SPEED: 0 mph
CHL CUP STRESS STAGE 2 GRADE: 0 %
CHL CUP STRESS STAGE 2 HR: 83 {beats}/min
CHL CUP STRESS STAGE 2 SPEED: 1 mph
CHL CUP STRESS STAGE 4 GRADE: 10 %
CHL CUP STRESS STAGE 4 SPEED: 1.7 mph
CHL CUP STRESS STAGE 5 GRADE: 12 %
CHL CUP STRESS STAGE 5 SBP: 149 mmHg
CHL CUP STRESS STAGE 5 SPEED: 2.5 mph
CHL CUP STRESS STAGE 6 GRADE: 14 %
CHL CUP STRESS STAGE 6 HR: 160 {beats}/min
CHL CUP STRESS STAGE 6 SPEED: 3.4 mph
CHL CUP STRESS STAGE 7 DBP: 56 mmHg
CHL CUP STRESS STAGE 7 HR: 146 {beats}/min
CSEPED: 6 min
CSEPEDS: 32 s
CSEPEW: 7.8 METS
CSEPHR: 101 %
CSEPPHR: 160 {beats}/min
CSEPPMHR: 100 %
MPHR: 159 {beats}/min
RPE: 16
Rest HR: 82 {beats}/min
Stage 1 DBP: 76 mmHg
Stage 3 Grade: 0 %
Stage 3 HR: 83 {beats}/min
Stage 3 Speed: 1 mph
Stage 4 DBP: 59 mmHg
Stage 4 HR: 133 {beats}/min
Stage 4 SBP: 154 mmHg
Stage 5 DBP: 63 mmHg
Stage 5 HR: 153 {beats}/min
Stage 7 Grade: 0 %
Stage 7 SBP: 137 mmHg
Stage 7 Speed: 0 mph
Stage 8 DBP: 61 mmHg
Stage 8 Grade: 0 %
Stage 8 HR: 79 {beats}/min
Stage 8 SBP: 110 mmHg
Stage 8 Speed: 0 mph

## 2015-04-02 NOTE — Progress Notes (Signed)
Cardiology Office Note   Date:  04/04/2015   ID:  Paula Salas, DOB July 23, 1955, MRN [id number]  PCP:  Lorretta Harp, MD  Cardiologist:   Madilyn Hook, MD   Chief Complaint  Patient presents with  . New Evaluation    had sharp chest pains a month ago, was [place]//has had some chest pressure since  . Shortness of Breath    on exertion  . Edema    bilateral ankles/feet when she travels  . Claudication    pain in bilateral calves sometimes  . Fatigue      History of Present Illness: Paula Salas is a 59 y.o. female with hyperlipidemia who presents for follow up on an abnormal stress test.  In August she was walking [place] in the [place] and felt a sharp chest pain.  She stopped and relaxed and then felt another pain when she tried to walk up the hill again.  It was associated with shortness of breath but no nausea or diaphoresis.  Since then she notes some chest pressure when walking up stairs.  When walking on flat land she denies symptoms.   The episodes last for several minutes at a time and the pain radiates to her neck and shoulders.  She reported these symptoms to her PCP who referred her for stress testing.  She underwent treadmill stress testing on 03/27/15 which revealed a hypotensive BP response as well as 2.5 mm ST depressions inferiorly.  She was referred to cardiology to follow up on the positive stress.  She endorses lower extremity edema after long car rides but denies orthopnea or PND.  She does not get any regular exercise but is very active.  She splits her time between [place] and [place], [place]. She also spends a significant amount of time in [place] and plans to travel there in the next month.    Past Medical History  Diagnosis Date  . Osteopenia     dexa 98 -1.4 and , -1.1, 99 -1.77 and -1.9, 2010 -1.9 -1.6 on bisphosphonate from 12  2003 to 4/07  . Macular degeneration   . History of retinal hemorrhage   .  Hyperlipidemia   . Lower urinary tract symptoms (LUTS)     had uro evaluation  in [place] [facility]?    Past Surgical History  Procedure Laterality Date  . Breast enhancement surgery    . Mri hip  06/04    neg     Current Outpatient Prescriptions  Medication Sig Dispense Refill  . acyclovir (ZOVIRAX) 5 % ointment Apply 1 application topically as needed (for lips).     . Ascorbic Acid (VITAMIN C) 1000 MG tablet Take 1,000 mg by mouth daily.    Marland Kitchen aspirin 81 MG tablet Take 81 mg by mouth daily.    . Calcium Carbonate-Vit D-Min (CALCIUM 1200 PO) Take 1,200 mg by mouth daily.     . ciprofloxacin (CIPRO) 500 MG tablet Take 1 tablet (500 mg total) by mouth 2 (two) times daily. If needed for uti (Patient not taking: Reported on 04/04/2015) 10 tablet 0  . Cranberry 125 MG TABS Take 125 mg by mouth daily.     . Estradiol 10 MCG TABS vaginal tablet Place 1 tablet (10 mcg total) vaginally as directed. (Patient not taking: Reported on 04/04/2015) 8 tablet 3  . fexofenadine-pseudoephedrine (ALLEGRA-D 24) 180-240 MG 24 hr tablet Take 1 tablet by mouth daily.    . mometasone (  NASONEX) 50 MCG/ACT nasal spray Place 2 sprays into both nostrils daily as needed (for allergies).     . naproxen sodium (ANAPROX) 220 MG tablet Take 220-440 mg by mouth daily as needed (for pain).     Bertram Gala Glycol-Propyl Glycol (SYSTANE PRESERVATIVE FREE OP) Place 1 drop into both eyes 2 (two) times daily as needed (for dry eyes).     . valACYclovir (VALTREX) 1000 MG tablet Take 1,000 mg by mouth 2 (two) times daily as needed (for lips).     . metoprolol tartrate (LOPRESSOR) 25 MG tablet Take 0.5 tablets (12.5 mg total) by mouth 2 (two) times daily. 180 tablet 3  . nitroGLYCERIN (NITROSTAT) 0.4 MG SL tablet Place 1 tablet (0.4 mg total) under the tongue every 5 (five) minutes as needed for chest pain. 25 tablet 6   No current facility-administered medications for this visit.    Allergies:   Clindamycin/lincomycin and  Oysters    Social History:  The patient  reports that she has never smoked. She has never used smokeless tobacco. She reports that she does not drink alcohol.   Family History:  The patient's family history includes Colon cancer in her other; Dementia in her mother; Osteoporosis in her other.    ROS:  Please see the history of present illness.   Otherwise, review of systems are positive for rhitintis, cough, congestion, arthritis, and early satiety.  All other systems are reviewed and negative.    PHYSICAL EXAM: VS:  BP 138/82 mmHg  Pulse 66  Ht  (1.549 m)  Wt 60.963 kg (134 lb 6.4 oz)  BMI 25.41 kg/m2 , BMI Body mass index is 25.41 kg/(m^2). GENERAL:  Well appearing HEENT:  Pupils equal round and reactive, fundi not visualized, oral mucosa unremarkable NECK:  No jugular venous distention, waveform within normal limits, carotid upstroke brisk and symmetric, no bruits, no thyromegaly LYMPHATICS:  No cervical adenopathy LUNGS:  Clear to auscultation bilaterally HEART:  RRR.  PMI not displaced or sustained,S1 and S2 within normal limits, no S3, no S4, no clicks, no rubs, no murmurs ABD:  Flat, positive bowel sounds normal in frequency in pitch, no bruits, no rebound, no guarding, no midline pulsatile mass, no hepatomegaly, no splenomegaly EXT:  2 plus pulses throughout, no edema, no cyanosis no clubbing SKIN:  No rashes no nodules NEURO:  Cranial nerves II through XII grossly intact, motor grossly intact throughout PSYCH:  Cognitively intact, oriented to person place and time   EKG:  EKG is not ordered today.   Recent Labs: 04/03/2015: ALT 12; BUN 19; Creat 0.72; Hemoglobin 13.1; Platelets 230; Potassium 4.7; Sodium 139   Treadmill stress 03/27/15:  Blood pressure demonstrated a hypotensive response to exercise.  ST segment depression was noted during stress in the II, III and aVF leads, beginning at 6 minutes of stress, ending at 1 minutes of stress, and returning to  baseline after less than 1 minute of recovery.  No T wave inversion was noted during stress.   Lipid Panel    Component Value Date/Time   CHOL 250* 03/23/2014 0802   TRIG 72.0 03/23/2014 0802   HDL 82.30 03/23/2014 0802   CHOLHDL 3 03/23/2014 0802   VLDL 14.4 03/23/2014 0802   LDLCALC 153* 03/23/2014 0802   LDLDIRECT 129.8 01/20/2013 0857      Wt Readings from Last 3 Encounters:  04/03/15 60.963 kg (134 lb 6.4 oz)  02/14/15 60.102 kg (132 lb 8 oz)  04/25/14 60.328 kg (133 lb)  ASSESSMENT AND PLAN:  # CCS Class II-III angina: Ms. Josph Macholejalde had a positive stress test and continues to have exertional symptoms.  She would prefer to go back to [place] for a heart cath due to insurance concerns.  Prior to returning to [place] she is planning an extended trip to [place] (over a month).  We discussed the fact that although this is not emergent, it is urgent, as her symptoms seem to be escalating.  Ideally, she would have th procedure before making any long plane rides and certainly it would be ideal to do it within the month.  She expressed understanding and will decide whether she will have it performed here, [place] or [place]. - Start metoprolol 12.5 mg bid and nitroglycerin 0.4mg  q5 min prn - Check lipid panel.  Will likely start high dose statin  # Hyperlipidemia: Check lipid panel   Current medicines are reviewed at length with the patient today.  The patient does not have concerns regarding medicines.  The following changes have been made:  Start aspirin and metoprolol  Labs/ tests ordered today include:   Orders Placed This Encounter  Procedures  . CBC  . Comprehensive metabolic panel  . Protime-INR  . APTT  . LEFT HEART CATHETERIZATION WITH CORONARY ANGIOGRAM     Disposition:   FU with Iona Stay C. Duke Salviaandolph, MD post cath or in May when she returns to [place].   Signed, Madilyn Hookandolph, Shayna Eblen P, MD  04/04/2015 10:42 PM    [facility]  HeartCare

## 2015-04-03 ENCOUNTER — Telehealth: Payer: Self-pay | Admitting: Cardiovascular Disease

## 2015-04-03 ENCOUNTER — Encounter: Payer: Self-pay | Admitting: Cardiovascular Disease

## 2015-04-03 ENCOUNTER — Ambulatory Visit (INDEPENDENT_AMBULATORY_CARE_PROVIDER_SITE_OTHER): Payer: BLUE CROSS/BLUE SHIELD | Admitting: Cardiovascular Disease

## 2015-04-03 VITALS — BP 138/82 | HR 66 | Ht 61.0 in | Wt 134.4 lb

## 2015-04-03 DIAGNOSIS — D689 Coagulation defect, unspecified: Secondary | ICD-10-CM

## 2015-04-03 DIAGNOSIS — Z79899 Other long term (current) drug therapy: Secondary | ICD-10-CM | POA: Diagnosis not present

## 2015-04-03 DIAGNOSIS — R9439 Abnormal result of other cardiovascular function study: Secondary | ICD-10-CM | POA: Diagnosis not present

## 2015-04-03 DIAGNOSIS — E785 Hyperlipidemia, unspecified: Secondary | ICD-10-CM | POA: Diagnosis not present

## 2015-04-03 MED ORDER — METOPROLOL TARTRATE 25 MG PO TABS: 12.5000 mg | ORAL_TABLET | Freq: Two times a day (BID) | ORAL | Status: AC

## 2015-04-03 MED ORDER — NITROGLYCERIN 0.4 MG SL SUBL: 0.4000 mg | SUBLINGUAL_TABLET | SUBLINGUAL | Status: DC | PRN

## 2015-04-03 NOTE — Telephone Encounter (Signed)
Patient would like to proceed with heart cath.

## 2015-04-03 NOTE — Patient Instructions (Addendum)
Your physician has recommended you make the following change in your medication: START METOPROLOL 25MG   -  TAKE 1/2 TABLET TWICE A DAY.  A PRESCRIPTION HAS BEEN SENT TO YOUR PHARMACY.  A PRESCRIPTION FOR NTG HAS BEEN SENT TO YOUR PHARMACY.  Your physician has requested that you have a cardiac catheterization. Cardiac catheterization is used to diagnose and/or treat various heart conditions. Doctors may recommend this procedure for a number of different reasons. The most common reason is to evaluate chest pain. Chest pain can be a symptom of coronary artery disease (CAD), and cardiac catheterization can show whether plaque is narrowing or blocking your heart's arteries. This procedure is also used to evaluate the valves, as well as measure the blood flow and oxygen levels in different parts of your heart. For further information please visit [email or web address]. Please follow instruction sheet, as given.  Your physician recommends that you return for lab work in: today at [facility] lab.   Dr. Royann Shiversroitoru recommends that you schedule a follow-up appointment in: MAY 2017 WITH DR. Keensburg.   Nitroglycerin sublingual tablets What is this medicine? NITROGLYCERIN (nye troe GLI ser in) is a type of vasodilator. It relaxes blood vessels, increasing the blood and oxygen supply to your heart. This medicine is used to relieve chest pain caused by angina. It is also used to prevent chest pain before activities like climbing stairs, going outdoors in cold weather, or sexual activity. This medicine may be used for other purposes; ask your health care provider or pharmacist if you have questions. What should I tell my health care provider before I take this medicine? They need to know if you have any of these conditions: -anemia -head injury, recent stroke, or bleeding in the brain -liver disease -previous heart attack -an unusual or allergic reaction to nitroglycerin, other medicines, foods, dyes, or  preservatives -pregnant or trying to get pregnant -breast-feeding How should I use this medicine? Take this medicine by mouth as needed. At the first sign of an angina attack (chest pain or tightness) place one tablet under your tongue. You can also take this medicine 5 to 10 minutes before an event likely to produce chest pain. Follow the directions on the prescription label. Let the tablet dissolve under the tongue. Do not swallow whole. Replace the dose if you accidentally swallow it. It will help if your mouth is not dry. Saliva around the tablet will help it to dissolve more quickly. Do not eat or drink, smoke or chew tobacco while a tablet is dissolving. If you are not better within 5 minutes after taking ONE dose of nitroglycerin, call 9-1-1 immediately to seek emergency medical care. Do not take more than 3 nitroglycerin tablets over 15 minutes. If you take this medicine often to relieve symptoms of angina, your doctor or health care professional may provide you with different instructions to manage your symptoms. If symptoms do not go away after following these instructions, it is important to call 9-1-1 immediately. Do not take more than 3 nitroglycerin tablets over 15 minutes. Talk to your pediatrician regarding the use of this medicine in children. Special care may be needed. Overdosage: If you think you have taken too much of this medicine contact a poison control center or emergency room at once. NOTE: This medicine is only for you. Do not share this medicine with others. What if I miss a dose? This does not apply. This medicine is only used as needed. What may interact with this medicine? Do not take  this medicine with any of the following medications: -certain migraine medicines like ergotamine and dihydroergotamine (DHE) -medicines used to treat erectile dysfunction like sildenafil, tadalafil, and vardenafil -riociguat This medicine may also interact with the following  medications: -alteplase -aspirin -heparin -medicines for high blood pressure -medicines for mental depression -other medicines used to treat angina -phenothiazines like chlorpromazine, mesoridazine, prochlorperazine, thioridazine This list may not describe all possible interactions. Give your health care provider a list of all the medicines, herbs, non-prescription drugs, or dietary supplements you use. Also tell them if you smoke, drink alcohol, or use illegal drugs. Some items may interact with your medicine. What should I watch for while using this medicine? Tell your doctor or health care professional if you feel your medicine is no longer working. Keep this medicine with you at all times. Sit or lie down when you take your medicine to prevent falling if you feel dizzy or faint after using it. Try to remain calm. This will help you to feel better faster. If you feel dizzy, take several deep breaths and lie down with your feet propped up, or bend forward with your head resting between your knees. You may get drowsy or dizzy. Do not drive, use machinery, or do anything that needs mental alertness until you know how this drug affects you. Do not stand or sit up quickly, especially if you are an older patient. This reduces the risk of dizzy or fainting spells. Alcohol can make you more drowsy and dizzy. Avoid alcoholic drinks. Do not treat yourself for coughs, colds, or pain while you are taking this medicine without asking your doctor or health care professional for advice. Some ingredients may increase your blood pressure. What side effects may I notice from receiving this medicine? Side effects that you should report to your doctor or health care professional as soon as possible: -blurred vision -dry mouth -skin rash -sweating -the feeling of extreme pressure in the head -unusually weak or tired Side effects that usually do not require medical attention (report to your doctor or health care  professional if they continue or are bothersome): -flushing of the face or neck -headache -irregular heartbeat, palpitations -nausea, vomiting This list may not describe all possible side effects. Call your doctor for medical advice about side effects. You may report side effects to FDA at 1-800-FDA-1088. Where should I keep my medicine? Keep out of the reach of children. Store at room temperature between 20 and 25 degrees C (68 and 77 degrees F). Store in Retail buyer. Protect from light and moisture. Keep tightly closed. Throw away any unused medicine after the expiration date. NOTE: This sheet is a summary. It may not cover all possible information. If you have questions about this medicine, talk to your doctor, pharmacist, or health care provider.    2016, Elsevier/Gold Standard. (2013-04-06 17:57:36)

## 2015-04-04 ENCOUNTER — Other Ambulatory Visit: Payer: Self-pay | Admitting: *Deleted

## 2015-04-04 ENCOUNTER — Encounter: Payer: Self-pay | Admitting: Cardiovascular Disease

## 2015-04-04 DIAGNOSIS — R9439 Abnormal result of other cardiovascular function study: Secondary | ICD-10-CM

## 2015-04-04 DIAGNOSIS — R072 Precordial pain: Secondary | ICD-10-CM

## 2015-04-04 LAB — CBC
HCT: 38 % (ref 36.0–46.0)
Hemoglobin: 13.1 g/dL (ref 12.0–15.0)
MCH: 31.1 pg (ref 26.0–34.0)
MCHC: 34.5 g/dL (ref 30.0–36.0)
MCV: 90.3 fL (ref 78.0–100.0)
MPV: 10.1 fL (ref 8.6–12.4)
PLATELETS: 230 10*3/uL (ref 150–400)
RBC: 4.21 MIL/uL (ref 3.87–5.11)
RDW: 14.2 % (ref 11.5–15.5)
WBC: 6.7 10*3/uL (ref 4.0–10.5)

## 2015-04-04 LAB — COMPREHENSIVE METABOLIC PANEL
ALT: 12 U/L (ref 6–29)
AST: 20 U/L (ref 10–35)
Albumin: 4.5 g/dL (ref 3.6–5.1)
Alkaline Phosphatase: 96 U/L (ref 33–130)
BUN: 19 mg/dL (ref 7–25)
CHLORIDE: 103 mmol/L (ref 98–110)
CO2: 26 mmol/L (ref 20–31)
Calcium: 9.7 mg/dL (ref 8.6–10.4)
Creat: 0.72 mg/dL (ref 0.50–0.99)
GLUCOSE: 77 mg/dL (ref 65–99)
POTASSIUM: 4.7 mmol/L (ref 3.5–5.3)
Sodium: 139 mmol/L (ref 135–146)
TOTAL PROTEIN: 6.9 g/dL (ref 6.1–8.1)
Total Bilirubin: 0.4 mg/dL (ref 0.2–1.2)

## 2015-04-04 LAB — APTT: aPTT: 28 seconds (ref 24–37)

## 2015-04-04 LAB — PROTIME-INR
INR: 0.91 (ref <1.50)
PROTHROMBIN TIME: 12.4 s (ref 11.6–15.2)

## 2015-04-04 NOTE — Telephone Encounter (Signed)
PATIENT SCHEDULE FOR CATH. THIS WAS TAKEN CARE OF ON 04/03/15

## 2015-04-05 ENCOUNTER — Telehealth: Payer: Self-pay | Admitting: *Deleted

## 2015-04-05 ENCOUNTER — Ambulatory Visit (HOSPITAL_COMMUNITY)
Admission: RE | Admit: 2015-04-05 | Discharge: 2015-04-05 | Disposition: A | Discharge status: Home / Self Care | Payer: BLUE CROSS/BLUE SHIELD | Source: Ambulatory Visit | Attending: Cardiovascular Disease | Admitting: Cardiovascular Disease

## 2015-04-05 ENCOUNTER — Encounter (HOSPITAL_COMMUNITY)
Admission: RE | Disposition: A | Discharge status: Home / Self Care | Payer: Self-pay | Source: Ambulatory Visit | Attending: Cardiovascular Disease

## 2015-04-05 ENCOUNTER — Encounter (HOSPITAL_COMMUNITY): Payer: Self-pay | Admitting: Cardiovascular Disease

## 2015-04-05 DIAGNOSIS — E785 Hyperlipidemia, unspecified: Secondary | ICD-10-CM | POA: Insufficient documentation

## 2015-04-05 DIAGNOSIS — M8580 Other specified disorders of bone density and structure, unspecified site: Secondary | ICD-10-CM | POA: Diagnosis not present

## 2015-04-05 DIAGNOSIS — R072 Precordial pain: Secondary | ICD-10-CM

## 2015-04-05 DIAGNOSIS — Z7982 Long term (current) use of aspirin: Secondary | ICD-10-CM | POA: Diagnosis not present

## 2015-04-05 DIAGNOSIS — R9439 Abnormal result of other cardiovascular function study: Secondary | ICD-10-CM | POA: Diagnosis not present

## 2015-04-05 DIAGNOSIS — I739 Peripheral vascular disease, unspecified: Secondary | ICD-10-CM | POA: Diagnosis not present

## 2015-04-05 DIAGNOSIS — I2510 Atherosclerotic heart disease of native coronary artery without angina pectoris: Secondary | ICD-10-CM | POA: Diagnosis not present

## 2015-04-05 DIAGNOSIS — H3530 Unspecified macular degeneration: Secondary | ICD-10-CM | POA: Insufficient documentation

## 2015-04-05 DIAGNOSIS — R5383 Other fatigue: Secondary | ICD-10-CM | POA: Diagnosis not present

## 2015-04-05 HISTORY — PX: CARDIAC CATHETERIZATION: SHX172

## 2015-04-05 SURGERY — LEFT HEART CATH AND CORONARY ANGIOGRAPHY
Anesthesia: LOCAL

## 2015-04-05 MED ORDER — NITROGLYCERIN 1 MG/10 ML FOR IR/CATH LAB
INTRA_ARTERIAL | Status: DC | PRN
  Administered 2015-04-05: 08:00:00

## 2015-04-05 MED ORDER — DIAZEPAM 5 MG PO TABS: 5.0000 mg | ORAL_TABLET | ORAL | Status: DC | PRN

## 2015-04-05 MED ORDER — FENTANYL CITRATE (PF) 100 MCG/2ML IJ SOLN
INTRAMUSCULAR | Status: AC
  Filled 2015-04-05: qty 4

## 2015-04-05 MED ORDER — SODIUM CHLORIDE 0.9 % WEIGHT BASED INFUSION: 3.0000 mL/kg/h | INTRAVENOUS | Status: DC

## 2015-04-05 MED ORDER — HEPARIN SODIUM (PORCINE) 1000 UNIT/ML IJ SOLN
INTRAMUSCULAR | Status: AC
  Filled 2015-04-05: qty 1

## 2015-04-05 MED ORDER — MIDAZOLAM HCL 2 MG/2ML IJ SOLN
INTRAMUSCULAR | Status: AC
  Filled 2015-04-05: qty 4

## 2015-04-05 MED ORDER — NITROGLYCERIN 1 MG/10 ML FOR IR/CATH LAB
INTRA_ARTERIAL | Status: AC
  Filled 2015-04-05: qty 10

## 2015-04-05 MED ORDER — FENTANYL CITRATE (PF) 100 MCG/2ML IJ SOLN
INTRAMUSCULAR | Status: DC | PRN
  Administered 2015-04-05 (×2): 25 ug via INTRAVENOUS

## 2015-04-05 MED ORDER — SODIUM CHLORIDE 0.9 % IJ SOLN: 3.0000 mL | INTRAMUSCULAR | Status: DC | PRN

## 2015-04-05 MED ORDER — ONDANSETRON HCL 4 MG/2ML IJ SOLN: 4.0000 mg | Freq: Four times a day (QID) | INTRAMUSCULAR | Status: DC | PRN

## 2015-04-05 MED ORDER — IOHEXOL 350 MG/ML SOLN
INTRAVENOUS | Status: DC | PRN
  Administered 2015-04-05: 80 mL via INTRACARDIAC

## 2015-04-05 MED ORDER — SODIUM CHLORIDE 0.9 % WEIGHT BASED INFUSION: 1.0000 mL/kg/h | INTRAVENOUS | Status: DC

## 2015-04-05 MED ORDER — MIDAZOLAM HCL 2 MG/2ML IJ SOLN
INTRAMUSCULAR | Status: DC | PRN
  Administered 2015-04-05: 2 mg via INTRAVENOUS
  Administered 2015-04-05: 1 mg via INTRAVENOUS

## 2015-04-05 MED ORDER — ACETAMINOPHEN 325 MG PO TABS: 650.0000 mg | ORAL_TABLET | ORAL | Status: DC | PRN

## 2015-04-05 MED ORDER — HEPARIN SODIUM (PORCINE) 1000 UNIT/ML IJ SOLN
INTRAMUSCULAR | Status: DC | PRN
Start: 2015-04-05 — End: 2015-04-05
  Administered 2015-04-05: 3000 [IU] via INTRAVENOUS

## 2015-04-05 MED ORDER — LIDOCAINE HCL (PF) 1 % IJ SOLN
INTRAMUSCULAR | Status: AC
  Filled 2015-04-05: qty 30

## 2015-04-05 MED ORDER — SODIUM CHLORIDE 0.9 % WEIGHT BASED INFUSION
3.0000 mL/kg/h | INTRAVENOUS | Status: AC
  Administered 2015-04-05: 3 mL/kg/h via INTRAVENOUS

## 2015-04-05 MED ORDER — ASPIRIN 81 MG PO CHEW
CHEWABLE_TABLET | ORAL | Status: AC
  Administered 2015-04-05: 81 mg
  Filled 2015-04-05: qty 1

## 2015-04-05 MED ORDER — SODIUM CHLORIDE 0.9 % IV SOLN: 250.0000 mL | INTRAVENOUS | Status: DC | PRN

## 2015-04-05 MED ORDER — SODIUM CHLORIDE 0.9 % IJ SOLN: 3.0000 mL | Freq: Two times a day (BID) | INTRAMUSCULAR | Status: DC

## 2015-04-05 MED ORDER — VERAPAMIL HCL 2.5 MG/ML IV SOLN
INTRA_ARTERIAL | Status: DC | PRN
  Administered 2015-04-05: 15 mL via INTRA_ARTERIAL

## 2015-04-05 MED ORDER — NITROGLYCERIN 1 MG/10 ML FOR IR/CATH LAB
INTRA_ARTERIAL | Status: DC | PRN
  Administered 2015-04-05: 200 ug via INTRACORONARY

## 2015-04-05 MED ORDER — HEPARIN (PORCINE) IN NACL 2-0.9 UNIT/ML-% IJ SOLN
INTRAMUSCULAR | Status: AC
  Filled 2015-04-05: qty 1000

## 2015-04-05 SURGICAL SUPPLY — 10 items
CATH INFINITI 5FR ANG PIGTAIL (CATHETERS) ×3 IMPLANT
CATH OPTITORQUE TIG 4.0 5F (CATHETERS) ×3 IMPLANT
DEVICE RAD COMP TR BAND LRG (VASCULAR PRODUCTS) ×3 IMPLANT
GLIDESHEATH SLEND A-KIT 6F 22G (SHEATH) ×3 IMPLANT
KIT HEART LEFT (KITS) ×3 IMPLANT
PACK CARDIAC CATHETERIZATION (CUSTOM PROCEDURE TRAY) ×3 IMPLANT
SYR MEDRAD MARK V 150ML (SYRINGE) ×3 IMPLANT
TRANSDUCER W/STOPCOCK (MISCELLANEOUS) ×3 IMPLANT
TUBING CIL FLEX 10 FLL-RA (TUBING) ×3 IMPLANT
WIRE SAFE-T 1.5MM-J .035X260CM (WIRE) ×3 IMPLANT

## 2015-04-05 NOTE — Discharge Instructions (Signed)
Radial Site Care °Refer to this sheet in the next few weeks. These instructions provide you with information about caring for yourself after your procedure. Your health care provider may also give you more specific instructions. Your treatment has been planned according to current medical practices, but problems sometimes occur. Call your health care provider if you have any problems or questions after your procedure. °WHAT TO EXPECT AFTER THE PROCEDURE °After your procedure, it is typical to have the following: °· Bruising at the radial site that usually fades within 1-2 weeks. °· Blood collecting in the tissue (hematoma) that may be painful to the touch. It should usually decrease in size and tenderness within 1-2 weeks. °HOME CARE INSTRUCTIONS °· Take medicines only as directed by your health care provider. °· You may shower 24-48 hours after the procedure or as directed by your health care provider. Remove the bandage (dressing) and gently wash the site with plain soap and water. Pat the area dry with a clean towel. Do not rub the site, because this may cause bleeding. °· Do not take baths, swim, or use a hot tub until your health care provider approves. °· Check your insertion site every day for redness, swelling, or drainage. °· Do not apply powder or lotion to the site. °· Do not flex or bend the affected arm for 24 hours or as directed by your health care provider. °· Do not push or pull heavy objects with the affected arm for 24 hours or as directed by your health care provider. °· Do not lift over 10 lb (4.5 kg) for 5 days after your procedure or as directed by your health care provider. °· Ask your health care provider when it is okay to: °¨ Return to work or school. °¨ Resume usual physical activities or sports. °¨ Resume sexual activity. °· Do not drive home if you are discharged the same day as the procedure. Have someone else drive you. °· You may drive 24 hours after the procedure unless otherwise  instructed by your health care provider. °· Do not operate machinery or power tools for 24 hours after the procedure. °· If your procedure was done as an outpatient procedure, which means that you went home the same day as your procedure, a responsible adult should be with you for the first 24 hours after you arrive home. °· Keep all follow-up visits as directed by your health care provider. This is important. °SEEK MEDICAL CARE IF: °· You have a fever. °· You have chills. °· You have increased bleeding from the radial site. Hold pressure on the site and call 911. °SEEK IMMEDIATE MEDICAL CARE IF: °· You have unusual pain at the radial site. °· You have redness, warmth, or swelling at the radial site. °· You have drainage (other than a small amount of blood on the dressing) from the radial site. °· The radial site is bleeding, and the bleeding does not stop after 30 minutes of holding steady pressure on the site. °· Your arm or hand becomes pale, cool, tingly, or numb. °  °This information is not intended to replace advice given to you by your health care provider. Make sure you discuss any questions you have with your health care provider. °  °Document Released: 07/11/2010 Document Revised: 06/29/2014 Document Reviewed: 12/25/2013 °Elsevier Interactive Patient Education ©2016 Elsevier Inc. ° °

## 2015-04-05 NOTE — Interval H&P Note (Signed)
Cath Lab Visit (complete for each Cath Lab visit)  Clinical Evaluation Leading to the Procedure:   ACS: No.  Non-ACS:    Anginal Classification: CCS II  Anti-ischemic medical therapy: Maximal Therapy (2 or more classes of medications)  Non-Invasive Test Results: Intermediate-risk stress test findings: cardiac mortality 1-3%/year  Prior CABG: No previous CABG      History and Physical Interval Note:  04/05/2015 7:47 AM  Paula Salas  has presented today for surgery, with the diagnosis of abnormal nuke  The various methods of treatment have been discussed with the patient and family. After consideration of risks, benefits and other options for treatment, the patient has consented to  Procedure(s): Left Heart Cath and Coronary Angiography (N/A) as a surgical intervention .  The patient's history has been reviewed, patient examined, no change in status, stable for surgery.  I have reviewed the patient's chart and labs.  Questions were answered to the patient's satisfaction.     Chaz Ronning A

## 2015-04-05 NOTE — H&P (View-Only) (Signed)
Cardiology Office Note   Date:  04/04/2015   ID:  Paula Salas, DOB 10/01/1955, MRN [id number]  PCP:  Paula Harp, MD  Cardiologist:   Paula Hook, MD   Chief Complaint  Patient presents with  . New Evaluation    had sharp chest pains a month ago, was [place]//has had some chest pressure since  . Shortness of Breath    on exertion  . Edema    bilateral ankles/feet when she travels  . Claudication    pain in bilateral calves sometimes  . Fatigue      History of Present Illness: Paula Salas is a 59 y.o. female with hyperlipidemia who presents for follow up on an abnormal stress test.  In August she was walking [place] in the [place] and felt a sharp chest pain.  She stopped and relaxed and then felt another pain when she tried to walk up the hill again.  It was associated with shortness of breath but no nausea or diaphoresis.  Since then she notes some chest pressure when walking up stairs.  When walking on flat land she denies symptoms.   The episodes last for several minutes at a time and the pain radiates to her neck and shoulders.  She reported these symptoms to her PCP who referred her for stress testing.  She underwent treadmill stress testing on 03/27/15 which revealed a hypotensive BP response as well as 2.5 mm ST depressions inferiorly.  She was referred to cardiology to follow up on the positive stress.  She endorses lower extremity edema after long car rides but denies orthopnea or PND.  She does not get any regular exercise but is very active.  She splits her time between [place] and [place], [place]. She also spends a significant amount of time in [place] and plans to travel there in the next month.    Past Medical History  Diagnosis Date  . Osteopenia     dexa 98 -1.4 and , -1.1, 99 -1.77 and -1.9, 2010 -1.9 -1.6 on bisphosphonate from 12  2003 to 4/07  . Macular degeneration   . History of retinal hemorrhage   .  Hyperlipidemia   . Lower urinary tract symptoms (LUTS)     had uro evaluation  in [place] [facility]?    Past Surgical History  Procedure Laterality Date  . Breast enhancement surgery    . Mri hip  06/04    neg     Current Outpatient Prescriptions  Medication Sig Dispense Refill  . acyclovir (ZOVIRAX) 5 % ointment Apply 1 application topically as needed (for lips).     . Ascorbic Acid (VITAMIN C) 1000 MG tablet Take 1,000 mg by mouth daily.    Marland Kitchen aspirin 81 MG tablet Take 81 mg by mouth daily.    . Calcium Carbonate-Vit D-Min (CALCIUM 1200 PO) Take 1,200 mg by mouth daily.     . ciprofloxacin (CIPRO) 500 MG tablet Take 1 tablet (500 mg total) by mouth 2 (two) times daily. If needed for uti (Patient not taking: Reported on 04/04/2015) 10 tablet 0  . Cranberry 125 MG TABS Take 125 mg by mouth daily.     . Estradiol 10 MCG TABS vaginal tablet Place 1 tablet (10 mcg total) vaginally as directed. (Patient not taking: Reported on 04/04/2015) 8 tablet 3  . fexofenadine-pseudoephedrine (ALLEGRA-D 24) 180-240 MG 24 hr tablet Take 1 tablet by mouth daily.    . mometasone (  NASONEX) 50 MCG/ACT nasal spray Place 2 sprays into both nostrils daily as needed (for allergies).     . naproxen sodium (ANAPROX) 220 MG tablet Take 220-440 mg by mouth daily as needed (for pain).     Bertram Gala Glycol-Propyl Glycol (SYSTANE PRESERVATIVE FREE OP) Place 1 drop into both eyes 2 (two) times daily as needed (for dry eyes).     . valACYclovir (VALTREX) 1000 MG tablet Take 1,000 mg by mouth 2 (two) times daily as needed (for lips).     . metoprolol tartrate (LOPRESSOR) 25 MG tablet Take 0.5 tablets (12.5 mg total) by mouth 2 (two) times daily. 180 tablet 3  . nitroGLYCERIN (NITROSTAT) 0.4 MG SL tablet Place 1 tablet (0.4 mg total) under the tongue every 5 (five) minutes as needed for chest pain. 25 tablet 6   No current facility-administered medications for this visit.    Allergies:   Clindamycin/lincomycin and  Oysters    Social History:  The patient  reports that she has never smoked. She has never used smokeless tobacco. She reports that she does not drink alcohol.   Family History:  The patient's family history includes Colon cancer in her other; Dementia in her mother; Osteoporosis in her other.    ROS:  Please see the history of present illness.   Otherwise, review of systems are positive for rhitintis, cough, congestion, arthritis, and early satiety.  All other systems are reviewed and negative.    PHYSICAL EXAM: VS:  BP 138/82 mmHg  Pulse 66  Ht  (1.549 m)  Wt 60.963 kg (134 lb 6.4 oz)  BMI 25.41 kg/m2 , BMI Body mass index is 25.41 kg/(m^2). GENERAL:  Well appearing HEENT:  Pupils equal round and reactive, fundi not visualized, oral mucosa unremarkable NECK:  No jugular venous distention, waveform within normal limits, carotid upstroke brisk and symmetric, no bruits, no thyromegaly LYMPHATICS:  No cervical adenopathy LUNGS:  Clear to auscultation bilaterally HEART:  RRR.  PMI not displaced or sustained,S1 and S2 within normal limits, no S3, no S4, no clicks, no rubs, no murmurs ABD:  Flat, positive bowel sounds normal in frequency in pitch, no bruits, no rebound, no guarding, no midline pulsatile mass, no hepatomegaly, no splenomegaly EXT:  2 plus pulses throughout, no edema, no cyanosis no clubbing SKIN:  No rashes no nodules NEURO:  Cranial nerves II through XII grossly intact, motor grossly intact throughout PSYCH:  Cognitively intact, oriented to person place and time   EKG:  EKG is not ordered today.   Recent Labs: 04/03/2015: ALT 12; BUN 19; Creat 0.72; Hemoglobin 13.1; Platelets 230; Potassium 4.7; Sodium 139   Treadmill stress 03/27/15:  Blood pressure demonstrated a hypotensive response to exercise.  ST segment depression was noted during stress in the II, III and aVF leads, beginning at 6 minutes of stress, ending at 1 minutes of stress, and returning to  baseline after less than 1 minute of recovery.  No T wave inversion was noted during stress.   Lipid Panel    Component Value Date/Time   CHOL 250* 03/23/2014 0802   TRIG 72.0 03/23/2014 0802   HDL 82.30 03/23/2014 0802   CHOLHDL 3 03/23/2014 0802   VLDL 14.4 03/23/2014 0802   LDLCALC 153* 03/23/2014 0802   LDLDIRECT 129.8 01/20/2013 0857      Wt Readings from Last 3 Encounters:  04/03/15 60.963 kg (134 lb 6.4 oz)  02/14/15 60.102 kg (132 lb 8 oz)  04/25/14 60.328 kg (133 lb)  ASSESSMENT AND PLAN:  # CCS Class II-III angina: Ms. Josph Macholejalde had a positive stress test and continues to have exertional symptoms.  She would prefer to go back to [place] for a heart cath due to insurance concerns.  Prior to returning to [place] she is planning an extended trip to [place] (over a month).  We discussed the fact that although this is not emergent, it is urgent, as her symptoms seem to be escalating.  Ideally, she would have th procedure before making any long plane rides and certainly it would be ideal to do it within the month.  She expressed understanding and will decide whether she will have it performed here, [place] or [place]. - Start metoprolol 12.5 mg bid and nitroglycerin 0.4mg  q5 min prn - Check lipid panel.  Will likely start high dose statin  # Hyperlipidemia: Check lipid panel   Current medicines are reviewed at length with the patient today.  The patient does not have concerns regarding medicines.  The following changes have been made:  Start aspirin and metoprolol  Labs/ tests ordered today include:   Orders Placed This Encounter  Procedures  . CBC  . Comprehensive metabolic panel  . Protime-INR  . APTT  . LEFT HEART CATHETERIZATION WITH CORONARY ANGIOGRAM     Disposition:   FU with Alacia Rehmann C. Duke Salviaandolph, MD post cath or in May when she returns to [place].   Signed, Paula Hookandolph, Tessica Cupo P, MD  04/04/2015 10:42 PM    [facility]  [facility]

## 2015-04-05 NOTE — Telephone Encounter (Signed)
-----   Message from Chilton Siiffany Hemingway, MD sent at 04/04/2015 11:06 PM EDT ----- Normal labs.

## 2015-04-05 NOTE — Telephone Encounter (Signed)
Spoke to patient. Result given . Verbalized understanding  

## 2015-04-08 ENCOUNTER — Telehealth: Payer: Self-pay | Admitting: Cardiovascular Disease

## 2015-04-08 NOTE — Telephone Encounter (Signed)
Received call back from patient.She stated she had cardiac cath 04/04/15.Stated cath site right wrist healing well no redness or swelling.Patient stated she would like PCP Dr.Panosh to get a copy.Copy of cath faxed to Dr.Panosh at fax # [phone].Also she would like a copy.Copy of cath lefted at front desk for pick up.

## 2015-04-08 NOTE — Telephone Encounter (Signed)
Returned call to patient no answer.LMTC. 

## 2015-04-08 NOTE — Telephone Encounter (Signed)
Pt called in stating that she had a catheterization done on 10/13 and she says that she has not received any extra instruction on what to do. please f/u with

## 2015-04-09 ENCOUNTER — Ambulatory Visit (INDEPENDENT_AMBULATORY_CARE_PROVIDER_SITE_OTHER): Payer: BLUE CROSS/BLUE SHIELD | Admitting: Internal Medicine

## 2015-04-09 ENCOUNTER — Encounter: Payer: Self-pay | Admitting: Internal Medicine

## 2015-04-09 VITALS — BP 110/68 | Temp 98.7°F | Ht 61.5 in | Wt 132.7 lb

## 2015-04-09 DIAGNOSIS — R079 Chest pain, unspecified: Secondary | ICD-10-CM | POA: Diagnosis not present

## 2015-04-09 DIAGNOSIS — N319 Neuromuscular dysfunction of bladder, unspecified: Secondary | ICD-10-CM | POA: Diagnosis not present

## 2015-04-09 DIAGNOSIS — Z1159 Encounter for screening for other viral diseases: Secondary | ICD-10-CM | POA: Diagnosis not present

## 2015-04-09 DIAGNOSIS — Z23 Encounter for immunization: Secondary | ICD-10-CM | POA: Diagnosis not present

## 2015-04-09 DIAGNOSIS — E785 Hyperlipidemia, unspecified: Secondary | ICD-10-CM | POA: Diagnosis not present

## 2015-04-09 DIAGNOSIS — Z0000 Encounter for general adult medical examination without abnormal findings: Secondary | ICD-10-CM

## 2015-04-09 LAB — LIPID PANEL
CHOLESTEROL: 257 mg/dL — AB (ref 0–200)
HDL: 85.1 mg/dL (ref >39.00)
LDL CALC: 161 mg/dL — AB (ref 0–99)
NonHDL: 172.39
TRIGLYCERIDES: 55 mg/dL (ref 0.0–149.0)
Total CHOL/HDL Ratio: 3
VLDL: 11 mg/dL (ref 0.0–40.0)

## 2015-04-09 LAB — TSH: TSH: 1.5 u[IU]/mL (ref 0.35–4.50)

## 2015-04-09 NOTE — Patient Instructions (Addendum)
Will   Send message   to cardiology  . You have  Possible reflux.  Stay  On the asa .   Make appt  For MAY  With cardiology when you get back   In town.    Will notify you  of labs when available. consideration of taking a statin medication .       Health Maintenance, Female Adopting a healthy lifestyle and getting preventive care can go a long way to promote health and wellness. Talk with your health care provider about what schedule of regular examinations is right for you. This is a good chance for you to check in with your provider about disease prevention and staying healthy. In between checkups, there are plenty of things you can do on your own. Experts have done a lot of research about which lifestyle changes and preventive measures are most likely to keep you healthy. Ask your health care provider for more information. WEIGHT AND DIET  Eat a healthy diet  Be sure to include plenty of vegetables, fruits, low-fat dairy products, and lean protein.  Do not eat a lot of foods high in solid fats, added sugars, or salt.  Get regular exercise. This is one of the most important things you can do for your health.  Most adults should exercise for at least 150 minutes each week. The exercise should increase your heart rate and make you sweat (moderate-intensity exercise).  Most adults should also do strengthening exercises at least twice a week. This is in addition to the moderate-intensity exercise.  Maintain a healthy weight  Body mass index (BMI) is a measurement that can be used to identify possible weight problems. It estimates body fat based on height and weight. Your health care provider can help determine your BMI and help you achieve or maintain a healthy weight.  For females 55 years of age and older:   A BMI below 18.5 is considered underweight.  A BMI of 18.5 to 24.9 is normal.  A BMI of 25 to 29.9 is considered overweight.  A BMI of 30 and above is considered obese.   Watch levels of cholesterol and blood lipids  You should start having your blood tested for lipids and cholesterol at 61 years of age, then have this test every 5 years.  You may need to have your cholesterol levels checked more often if:  Your lipid or cholesterol levels are high.  You are older than 61 years of age.  You are at high risk for heart disease.  CANCER SCREENING   Lung Cancer  Lung cancer screening is recommended for adults 89-57 years old who are at high risk for lung cancer because of a history of smoking.  A yearly low-dose CT scan of the lungs is recommended for people who:  Currently smoke.  Have quit within the past 15 years.  Have at least a 30-pack-year history of smoking. A pack year is smoking an average of one pack of cigarettes a day for 1 year.  Yearly screening should continue until it has been 15 years since you quit.  Yearly screening should stop if you develop a health problem that would prevent you from having lung cancer treatment.  Breast Cancer  Practice breast self-awareness. This means understanding how your breasts normally appear and feel.  It also means doing regular breast self-exams. Let your health care provider know about any changes, no matter how small.  If you are in your 20s or 30s, you  should have a clinical breast exam (CBE) by a health care provider every 1-3 years as part of a regular health exam.  If you are 23 or older, have a CBE every year. Also consider having a breast X-ray (mammogram) every year.  If you have a family history of breast cancer, talk to your health care provider about genetic screening.  If you are at high risk for breast cancer, talk to your health care provider about having an MRI and a mammogram every year.  Breast cancer gene (BRCA) assessment is recommended for women who have family members with BRCA-related cancers. BRCA-related cancers  include:  Breast.  Ovarian.  Tubal.  Peritoneal cancers.  Results of the assessment will determine the need for genetic counseling and BRCA1 and BRCA2 testing. Cervical Cancer Your health care provider may recommend that you be screened regularly for cancer of the pelvic organs (ovaries, uterus, and vagina). This screening involves a pelvic examination, including checking for microscopic changes to the surface of your cervix (Pap test). You may be encouraged to have this screening done every 3 years, beginning at age 36.  For women ages 61-65, health care providers may recommend pelvic exams and Pap testing every 3 years, or they may recommend the Pap and pelvic exam, combined with testing for human papilloma virus (HPV), every 5 years. Some types of HPV increase your risk of cervical cancer. Testing for HPV may also be done on women of any age with unclear Pap test results.  Other health care providers may not recommend any screening for nonpregnant women who are considered low risk for pelvic cancer and who do not have symptoms. Ask your health care provider if a screening pelvic exam is right for you.  If you have had past treatment for cervical cancer or a condition that could lead to cancer, you need Pap tests and screening for cancer for at least 20 years after your treatment. If Pap tests have been discontinued, your risk factors (such as having a new sexual partner) need to be reassessed to determine if screening should resume. Some women have medical problems that increase the chance of getting cervical cancer. In these cases, your health care provider may recommend more frequent screening and Pap tests. Colorectal Cancer  This type of cancer can be detected and often prevented.  Routine colorectal cancer screening usually begins at 61 years of age and continues through 61 years of age.  Your health care provider may recommend screening at an earlier age if you have risk factors for  colon cancer.  Your health care provider may also recommend using home test kits to check for hidden blood in the stool.  A small camera at the end of a tube can be used to examine your colon directly (sigmoidoscopy or colonoscopy). This is done to check for the earliest forms of colorectal cancer.  Routine screening usually begins at age 33.  Direct examination of the colon should be repeated every 5-10 years through 61 years of age. However, you may need to be screened more often if early forms of precancerous polyps or small growths are found. Skin Cancer  Check your skin from head to toe regularly.  Tell your health care provider about any new moles or changes in moles, especially if there is a change in a mole's shape or color.  Also tell your health care provider if you have a mole that is larger than the size of a pencil eraser.  Always use sunscreen.  Apply sunscreen liberally and repeatedly throughout the day.  Protect yourself by wearing long sleeves, pants, a wide-brimmed hat, and sunglasses whenever you are outside. HEART DISEASE, DIABETES, AND HIGH BLOOD PRESSURE   High blood pressure causes heart disease and increases the risk of stroke. High blood pressure is more likely to develop in:  People who have blood pressure in the high end of the normal range (130-139/85-89 mm Hg).  People who are overweight or obese.  People who are African American.  If you are 59-61 years of age, have your blood pressure checked every 3-5 years. If you are 27 years of age or older, have your blood pressure checked every year. You should have your blood pressure measured twice--once when you are at a hospital or clinic, and once when you are not at a hospital or clinic. Record the average of the two measurements. To check your blood pressure when you are not at a hospital or clinic, you can use:  An automated blood pressure machine at a pharmacy.  A home blood pressure monitor.  If you  are between 22 years and 26 years old, ask your health care provider if you should take aspirin to prevent strokes.  Have regular diabetes screenings. This involves taking a blood sample to check your fasting blood sugar level.  If you are at a normal weight and have a low risk for diabetes, have this test once every three years after 61 years of age.  If you are overweight and have a high risk for diabetes, consider being tested at a younger age or more often. PREVENTING INFECTION  Hepatitis B  If you have a higher risk for hepatitis B, you should be screened for this virus. You are considered at high risk for hepatitis B if:  You were born in a country where hepatitis B is common. Ask your health care provider which countries are considered high risk.  Your parents were born in a high-risk country, and you have not been immunized against hepatitis B (hepatitis B vaccine).  You have HIV or AIDS.  You use needles to inject street drugs.  You live with someone who has hepatitis B.  You have had sex with someone who has hepatitis B.  You get hemodialysis treatment.  You take certain medicines for conditions, including cancer, organ transplantation, and autoimmune conditions. Hepatitis C  Blood testing is recommended for:  Everyone born from [birth year] through 1967.  Anyone with known risk factors for hepatitis C. Sexually transmitted infections (STIs)  You should be screened for sexually transmitted infections (STIs) including gonorrhea and chlamydia if:  You are sexually active and are younger than 61 years of age.  You are older than 61 years of age and your health care provider tells you that you are at risk for this type of infection.  Your sexual activity has changed since you were last screened and you are at an increased risk for chlamydia or gonorrhea. Ask your health care provider if you are at risk.  If you do not have HIV, but are at risk, it may be recommended that you  take a prescription medicine daily to prevent HIV infection. This is called pre-exposure prophylaxis (PrEP). You are considered at risk if:  You are sexually active and do not regularly use condoms or know the HIV status of your partner(s).  You take drugs by injection.  You are sexually active with a partner who has HIV. Talk with your health care provider about  whether you are at high risk of being infected with HIV. If you choose to begin PrEP, you should first be tested for HIV. You should then be tested every 3 months for as long as you are taking PrEP.  PREGNANCY   If you are premenopausal and you may become pregnant, ask your health care provider about preconception counseling.  If you may become pregnant, take 400 to 800 micrograms (mcg) of folic acid every day.  If you want to prevent pregnancy, talk to your health care provider about birth control (contraception). OSTEOPOROSIS AND MENOPAUSE   Osteoporosis is a disease in which the bones lose minerals and strength with aging. This can result in serious bone fractures. Your risk for osteoporosis can be identified using a bone density scan.  If you are 4 years of age or older, or if you are at risk for osteoporosis and fractures, ask your health care provider if you should be screened.  Ask your health care provider whether you should take a calcium or vitamin D supplement to lower your risk for osteoporosis.  Menopause may have certain physical symptoms and risks.  Hormone replacement therapy may reduce some of these symptoms and risks. Talk to your health care provider about whether hormone replacement therapy is right for you.  HOME CARE INSTRUCTIONS   Schedule regular health, dental, and eye exams.  Stay current with your immunizations.   Do not use any tobacco products including cigarettes, chewing tobacco, or electronic cigarettes.  If you are pregnant, do not drink alcohol.  If you are breastfeeding, limit how  much and how often you drink alcohol.  Limit alcohol intake to no more than 1 drink per day for nonpregnant women. One drink equals 12 ounces of beer, 5 ounces of wine, or 1 ounces of hard liquor.  Do not use street drugs.  Do not share needles.  Ask your health care provider for help if you need support or information about quitting drugs.  Tell your health care provider if you often feel depressed.  Tell your health care provider if you have ever been abused or do not feel safe at home.   This information is not intended to replace advice given to you by your health care provider. Make sure you discuss any questions you have with your health care provider.   Document Released: 12/22/2010 Document Revised: 06/29/2014 Document Reviewed: 05/10/2013 Elsevier Interactive Patient Education 2016 [place] for Gastroesophageal Reflux Disease, Adult When you have gastroesophageal reflux disease (GERD), the foods you eat and your eating habits are very important. Choosing the right foods can help ease the discomfort of GERD. WHAT GENERAL GUIDELINES DO I NEED TO FOLLOW?  Choose fruits, vegetables, whole grains, low-fat dairy products, and low-fat meat, fish, and poultry.  Limit fats such as oils, salad dressings, butter, nuts, and avocado.  Keep a food diary to identify foods that cause symptoms.  Avoid foods that cause reflux. These may be different for different people.  Eat frequent small meals instead of three large meals each day.  Eat your meals slowly, in a relaxed setting.  Limit fried foods.  Cook foods using methods other than frying.  Avoid drinking alcohol.  Avoid drinking large amounts of liquids with your meals.  Avoid bending over or lying down until 2-3 hours after eating. WHAT FOODS ARE NOT RECOMMENDED? The following are some foods and drinks that may worsen your symptoms: Vegetables Tomatoes. Tomato juice. Tomato and spaghetti sauce. Chili  peppers. Onion and garlic. Horseradish. Fruits Oranges, grapefruit, and lemon (fruit and juice). Meats High-fat meats, fish, and poultry. This includes hot dogs, ribs, ham, sausage, salami, and bacon. Dairy Whole milk and chocolate milk. Sour cream. Cream. Butter. Ice cream. Cream cheese.  Beverages Coffee and tea, with or without caffeine. Carbonated beverages or energy drinks. Condiments Hot sauce. Barbecue sauce.  Sweets/Desserts Chocolate and cocoa. Donuts. Peppermint and spearmint. Fats and Oils High-fat foods, including [place] fries and potato chips. Other Vinegar. Strong spices, such as black pepper, white pepper, red pepper, cayenne, curry powder, cloves, ginger, and chili powder. The items listed above may not be a complete list of foods and beverages to avoid. Contact your dietitian for more information.   This information is not intended to replace advice given to you by your health care provider. Make sure you discuss any questions you have with your health care provider.   Document Released: 06/08/2005 Document Revised: 06/29/2014 Document Reviewed: 04/12/2013 Elsevier Interactive Patient Education [employer].

## 2015-04-09 NOTE — Progress Notes (Signed)
Pre visit review using our clinic review tool, if applicable. No additional management support is needed unless otherwise documented below in the visit note.  Chief Complaint  Patient presents with  . Annual Exam    Just had cardiac cath    HPI: Patient  Paula Salas  59 y.o. comes in today for Preventive Health Care visit  See last visit we had sent her for an  PO ETT which turned out to be abnormal on some ST changes and at hypotensive response..  Cardiac cath showed  And minimal cad  30% LAD And nl lv function on  cath had some spasm. She had been given metoprolol twice a day and nitroglycerin as needed to take before the catheterization. She never took it nor got it filled. She's going to [place] this week like she usually does for the winter. She will be seeing her physicians in [place] also. She thinks that her chest discomfort could be from her reflux that she has had. No new symptoms.   Health Maintenance  Topic Date Due  . HIV Screening  04/08/2016 (Originally 01/18/1971)  . INFLUENZA VACCINE  01/21/2016  . MAMMOGRAM  04/25/2016  . PAP SMEAR  05/15/2016  . COLONOSCOPY  04/10/2017  . TETANUS/TDAP  05/09/2019  . ZOSTAVAX  Completed  . Hepatitis C Screening  Completed   Health Maintenance Review LIFESTYLE:  Exercise:   Yes  Tobacco/ETS: no Alcohol:  Rarely  Sugar beverages: Sleep:  8 hours  Some snoring  Drug use: no    ROS: See above still has bladder issues urinary difficulty GEN/ HEENT: No fever, significant weight changes sweats headaches  New vision problems hearing changes, CV/ PULM; No  cough, syncope,edema  change in exercise tolerance. GI /GU: No adominal pain, vomiting, change in bowel habits. No blood in the stool.SKIN/HEME: ,no acute skin rashes suspicious lesions or bleeding. No lymphadenopathy, nodules, masses.  NEURO/ PSYCH:  No neurologic signs such as weakness numbness. No depression anxiety. IMM/ Allergy: No unusual infections.  Allergy .     REST of 12 system review negative except as per HPI   Past Medical History  Diagnosis Date  . Osteopenia     dexa 98 -1.4 and , -1.1, 99 -1.77 and -1.9, 2010 -1.9 -1.6 on bisphosphonate from 12  2003 to 4/07  . Macular degeneration   . History of retinal hemorrhage   . Hyperlipidemia   . Lower urinary tract symptoms (LUTS)     had uro evaluation  in [place] [facility]?    Past Surgical History  Procedure Laterality Date  . Breast enhancement surgery    . Mri hip  06/04    neg  . Cardiac catheterization N/A 04/05/2015    Procedure: Left Heart Cath and Coronary Angiography;  Surgeon: Troy Sine, MD;  Location: [facility];  Service: Cardiovascular;  Laterality: N/A;    Family History  Problem Relation Age of Onset  . Osteoporosis Other   . Colon cancer Other   . Dementia Mother     Social History   Social History  . Marital Status: Single    Spouse Name: N/A  . Number of Children: N/A  . Years of Education: N/A   Social History Main Topics  . Smoking status: Never Smoker   . Smokeless tobacco: Never Used  . Alcohol Use: No  . Drug Use: None  . Sexual Activity: Not Asked   Other Topics Concern  . None   Social History  Narrative   Single   Back to [facility] for the winter    [facility] of [facility]    No pets   Sept  coloumbia  mom  Dementia for a month.  Then [facility] area Till April              Outpatient Prescriptions Prior to Visit  Medication Sig Dispense Refill  . acyclovir (ZOVIRAX) 5 % ointment Apply 1 application topically as needed (for lips).     . Ascorbic Acid (VITAMIN C) 1000 MG tablet Take 1,000 mg by mouth daily.    Marland Kitchen aspirin 81 MG tablet Take 81 mg by mouth daily.    . Calcium Carbonate-Vit D-Min (CALCIUM 1200 PO) Take 1,200 mg by mouth daily.     . Cranberry 125 MG TABS Take 125 mg by mouth daily.     . fexofenadine-pseudoephedrine (ALLEGRA-D 24) 180-240 MG 24 hr tablet Take 1 tablet by mouth daily.    . mometasone (NASONEX) 50 MCG/ACT  nasal spray Place 2 sprays into both nostrils daily as needed (for allergies).     . naproxen sodium (ANAPROX) 220 MG tablet Take 220-440 mg by mouth daily as needed (for pain).     Vladimir Faster Glycol-Propyl Glycol (SYSTANE PRESERVATIVE FREE OP) Place 1 drop into both eyes 2 (two) times daily as needed (for dry eyes).     . valACYclovir (VALTREX) 1000 MG tablet Take 1,000 mg by mouth 2 (two) times daily as needed (for lips).     . ciprofloxacin (CIPRO) 500 MG tablet Take 1 tablet (500 mg total) by mouth 2 (two) times daily. If needed for uti (Patient not taking: Reported on 04/04/2015) 10 tablet 0  . Estradiol 10 MCG TABS vaginal tablet Place 1 tablet (10 mcg total) vaginally as directed. (Patient not taking: Reported on 04/04/2015) 8 tablet 3  . metoprolol tartrate (LOPRESSOR) 25 MG tablet Take 0.5 tablets (12.5 mg total) by mouth 2 (two) times daily. (Patient not taking: Reported on 04/09/2015) 180 tablet 3  . nitroGLYCERIN (NITROSTAT) 0.4 MG SL tablet Place 1 tablet (0.4 mg total) under the tongue every 5 (five) minutes as needed for chest pain. (Patient not taking: Reported on 04/09/2015) 25 tablet 6   No facility-administered medications prior to visit.     EXAM:  BP 110/68 mmHg  Temp(Src) 98.7 F (37.1 C) (Oral)  Ht 5' 1.5" (1.562 m)  Wt 132 lb 11.2 oz (60.192 kg)  BMI 24.67 kg/m2  Body mass index is 24.67 kg/(m^2).  Physical Exam: Vital signs reviewed BSW:HQPR is a well-developed well-nourished alert cooperative    who appearsr stated age in no acute distress.  HEENT: normocephalic atraumatic , Eyes: PERRL EOM's full, conjunctiva clear, Nares: paten,t no deformity discharge or tenderness., Ears: no deformity EAC's clear TMs with normal landmarks. Mouth: clear OP, no lesions, edema.  Moist mucous membranes. Dentition in adequate repair. NECK: supple without masses, thyromegaly or bruits. CHEST/PULM:  Clear to auscultation and percussion breath sounds equal no wheeze , rales or  rhonchi. No chest wall deformities or tenderness. CV: PMI is nondisplaced, S1 S2 no gallops, murmurs, rubs. Peripheral pulses are full without delay.No JVD .  ABDOMEN: Bowel sounds normal nontender  No guard or rebound, no hepato splenomegal no CVA tenderness.  No hernia. Extremtities:  No clubbing cyanosis or edema, no acute joint swelling or redness no focal atrophy NEURO:  Oriented x3, cranial nerves 3-12 appear to be intact, no obvious focal weakness,gait within normal limits no abnormal reflexes  or asymmetrical SKIN: No acute rashes normal turgor, color, no bruising or petechiae. PSYCH: Oriented, good eye contact, no obvious depression anxiety, cognition and judgment appear normal. LN: no cervical axillary inguinal adenopathy  Lab Results  Component Value Date   WBC 6.7 04/03/2015   HGB 13.1 04/03/2015   HCT 38.0 04/03/2015   PLT 230 04/03/2015   GLUCOSE 77 04/03/2015   CHOL 257* 04/09/2015   TRIG 55.0 04/09/2015   HDL 85.10 04/09/2015   LDLDIRECT 129.8 01/20/2013   LDLCALC 161* 04/09/2015   ALT 12 04/03/2015   AST 20 04/03/2015   NA 139 04/03/2015   K 4.7 04/03/2015   CL 103 04/03/2015   CREATININE 0.72 04/03/2015   BUN 19 04/03/2015   CO2 26 04/03/2015   TSH 1.50 04/09/2015   INR 0.91 04/03/2015    ASSESSMENT AND PLAN:  Discussed the following assessment and plan:  Visit for preventive health examination - Plan: Lipid panel, TSH, Hep C Antibody  Need for prophylactic vaccination and inoculation against influenza - Plan: Flu Vaccine QUAD 36+ mos PF IM (Fluarix & Fluzone Quad PF)  Need for hepatitis C screening test - Plan: Hep C Antibody  Hyperlipidemia  Bladder dysfunction  Chest pain on exertion - See text minimal  non critical coronary disease spasm on cath. Currently no problem /patient thinks it's related to her esophagus.  She hasn't taken the beta blockers and nitroglycerin doesn't really think she needs it discussed with her before the cath .  She thinks  some of her pain as from reflux. Did discuss spasm as the cause of pain without obvious obstruction that sometimes can happen. We'll also check her lipid level and she can discuss this with cardiology about the risk benefit of being on medicine or not. She never got clear direction about what to do now asks after the catheterization. Red the previous note she should make appointment with cardiology in May when she comes back as she is to leave town this week. Patient Care Team: Burnis Medin, MD as PCP - General Hurman Horn, MD as Consulting Physician (Ophthalmology) urolopgist in [place] Patient Instructions  Will   Send message   to cardiology  . You have  Possible reflux.  Stay  On the asa .   Make appt  For MAY  With cardiology when you get back   In town.    Will notify you  of labs when available. consideration of taking a statin medication .       Health Maintenance, Female Adopting a healthy lifestyle and getting preventive care can go a long way to promote health and wellness. Talk with your health care provider about what schedule of regular examinations is right for you. This is a good chance for you to check in with your provider about disease prevention and staying healthy. In between checkups, there are plenty of things you can do on your own. Experts have done a lot of research about which lifestyle changes and preventive measures are most likely to keep you healthy. Ask your health care provider for more information. WEIGHT AND DIET  Eat a healthy diet  Be sure to include plenty of vegetables, fruits, low-fat dairy products, and lean protein.  Do not eat a lot of foods high in solid fats, added sugars, or salt.  Get regular exercise. This is one of the most important things you can do for your health.  Most adults should exercise for at least 150 minutes each  week. The exercise should increase your heart rate and make you sweat (moderate-intensity exercise).  Most  adults should also do strengthening exercises at least twice a week. This is in addition to the moderate-intensity exercise.  Maintain a healthy weight  Body mass index (BMI) is a measurement that can be used to identify possible weight problems. It estimates body fat based on height and weight. Your health care provider can help determine your BMI and help you achieve or maintain a healthy weight.  For females 35 years of age and older:   A BMI below 18.5 is considered underweight.  A BMI of 18.5 to 24.9 is normal.  A BMI of 25 to 29.9 is considered overweight.  A BMI of 30 and above is considered obese.  Watch levels of cholesterol and blood lipids  You should start having your blood tested for lipids and cholesterol at 61 years of age, then have this test every 5 years.  You may need to have your cholesterol levels checked more often if:  Your lipid or cholesterol levels are high.  You are older than 61 years of age.  You are at high risk for heart disease.  CANCER SCREENING   Lung Cancer  Lung cancer screening is recommended for adults 98-67 years old who are at high risk for lung cancer because of a history of smoking.  A yearly low-dose CT scan of the lungs is recommended for people who:  Currently smoke.  Have quit within the past 15 years.  Have at least a 30-pack-year history of smoking. A pack year is smoking an average of one pack of cigarettes a day for 1 year.  Yearly screening should continue until it has been 15 years since you quit.  Yearly screening should stop if you develop a health problem that would prevent you from having lung cancer treatment.  Breast Cancer  Practice breast self-awareness. This means understanding how your breasts normally appear and feel.  It also means doing regular breast self-exams. Let your health care provider know about any changes, no matter how small.  If you are in your 20s or 30s, you should have a clinical  breast exam (CBE) by a health care provider every 1-3 years as part of a regular health exam.  If you are 30 or older, have a CBE every year. Also consider having a breast X-ray (mammogram) every year.  If you have a family history of breast cancer, talk to your health care provider about genetic screening.  If you are at high risk for breast cancer, talk to your health care provider about having an MRI and a mammogram every year.  Breast cancer gene (BRCA) assessment is recommended for women who have family members with BRCA-related cancers. BRCA-related cancers include:  Breast.  Ovarian.  Tubal.  Peritoneal cancers.  Results of the assessment will determine the need for genetic counseling and BRCA1 and BRCA2 testing. Cervical Cancer Your health care provider may recommend that you be screened regularly for cancer of the pelvic organs (ovaries, uterus, and vagina). This screening involves a pelvic examination, including checking for microscopic changes to the surface of your cervix (Pap test). You may be encouraged to have this screening done every 3 years, beginning at age 52.  For women ages 49-65, health care providers may recommend pelvic exams and Pap testing every 3 years, or they may recommend the Pap and pelvic exam, combined with testing for human papilloma virus (HPV), every 5 years. Some types  of HPV increase your risk of cervical cancer. Testing for HPV may also be done on women of any age with unclear Pap test results.  Other health care providers may not recommend any screening for nonpregnant women who are considered low risk for pelvic cancer and who do not have symptoms. Ask your health care provider if a screening pelvic exam is right for you.  If you have had past treatment for cervical cancer or a condition that could lead to cancer, you need Pap tests and screening for cancer for at least 20 years after your treatment. If Pap tests have been discontinued, your risk  factors (such as having a new sexual partner) need to be reassessed to determine if screening should resume. Some women have medical problems that increase the chance of getting cervical cancer. In these cases, your health care provider may recommend more frequent screening and Pap tests. Colorectal Cancer  This type of cancer can be detected and often prevented.  Routine colorectal cancer screening usually begins at 61 years of age and continues through 62 years of age.  Your health care provider may recommend screening at an earlier age if you have risk factors for colon cancer.  Your health care provider may also recommend using home test kits to check for hidden blood in the stool.  A small camera at the end of a tube can be used to examine your colon directly (sigmoidoscopy or colonoscopy). This is done to check for the earliest forms of colorectal cancer.  Routine screening usually begins at age 90.  Direct examination of the colon should be repeated every 5-10 years through 61 years of age. However, you may need to be screened more often if early forms of precancerous polyps or small growths are found. Skin Cancer  Check your skin from head to toe regularly.  Tell your health care provider about any new moles or changes in moles, especially if there is a change in a mole's shape or color.  Also tell your health care provider if you have a mole that is larger than the size of a pencil eraser.  Always use sunscreen. Apply sunscreen liberally and repeatedly throughout the day.  Protect yourself by wearing long sleeves, pants, a wide-brimmed hat, and sunglasses whenever you are outside. HEART DISEASE, DIABETES, AND HIGH BLOOD PRESSURE   High blood pressure causes heart disease and increases the risk of stroke. High blood pressure is more likely to develop in:  People who have blood pressure in the high end of the normal range (130-139/85-89 mm Hg).  People who are overweight or  obese.  People who are African American.  If you are 29-43 years of age, have your blood pressure checked every 3-5 years. If you are 42 years of age or older, have your blood pressure checked every year. You should have your blood pressure measured twice--once when you are at a hospital or clinic, and once when you are not at a hospital or clinic. Record the average of the two measurements. To check your blood pressure when you are not at a hospital or clinic, you can use:  An automated blood pressure machine at a pharmacy.  A home blood pressure monitor.  If you are between 20 years and 67 years old, ask your health care provider if you should take aspirin to prevent strokes.  Have regular diabetes screenings. This involves taking a blood sample to check your fasting blood sugar level.  If you are at a  normal weight and have a low risk for diabetes, have this test once every three years after 61 years of age.  If you are overweight and have a high risk for diabetes, consider being tested at a younger age or more often. PREVENTING INFECTION  Hepatitis B  If you have a higher risk for hepatitis B, you should be screened for this virus. You are considered at high risk for hepatitis B if:  You were born in a country where hepatitis B is common. Ask your health care provider which countries are considered high risk.  Your parents were born in a high-risk country, and you have not been immunized against hepatitis B (hepatitis B vaccine).  You have HIV or AIDS.  You use needles to inject street drugs.  You live with someone who has hepatitis B.  You have had sex with someone who has hepatitis B.  You get hemodialysis treatment.  You take certain medicines for conditions, including cancer, organ transplantation, and autoimmune conditions. Hepatitis C  Blood testing is recommended for:  Everyone born from [birth year] through 1967.  Anyone with known risk factors for hepatitis  C. Sexually transmitted infections (STIs)  You should be screened for sexually transmitted infections (STIs) including gonorrhea and chlamydia if:  You are sexually active and are younger than 61 years of age.  You are older than 61 years of age and your health care provider tells you that you are at risk for this type of infection.  Your sexual activity has changed since you were last screened and you are at an increased risk for chlamydia or gonorrhea. Ask your health care provider if you are at risk.  If you do not have HIV, but are at risk, it may be recommended that you take a prescription medicine daily to prevent HIV infection. This is called pre-exposure prophylaxis (PrEP). You are considered at risk if:  You are sexually active and do not regularly use condoms or know the HIV status of your partner(s).  You take drugs by injection.  You are sexually active with a partner who has HIV. Talk with your health care provider about whether you are at high risk of being infected with HIV. If you choose to begin PrEP, you should first be tested for HIV. You should then be tested every 3 months for as long as you are taking PrEP.  PREGNANCY   If you are premenopausal and you may become pregnant, ask your health care provider about preconception counseling.  If you may become pregnant, take 400 to 800 micrograms (mcg) of folic acid every day.  If you want to prevent pregnancy, talk to your health care provider about birth control (contraception). OSTEOPOROSIS AND MENOPAUSE   Osteoporosis is a disease in which the bones lose minerals and strength with aging. This can result in serious bone fractures. Your risk for osteoporosis can be identified using a bone density scan.  If you are 54 years of age or older, or if you are at risk for osteoporosis and fractures, ask your health care provider if you should be screened.  Ask your health care provider whether you should take a calcium or  vitamin D supplement to lower your risk for osteoporosis.  Menopause may have certain physical symptoms and risks.  Hormone replacement therapy may reduce some of these symptoms and risks. Talk to your health care provider about whether hormone replacement therapy is right for you.  HOME CARE INSTRUCTIONS   Schedule regular health, dental,  and eye exams.  Stay current with your immunizations.   Do not use any tobacco products including cigarettes, chewing tobacco, or electronic cigarettes.  If you are pregnant, do not drink alcohol.  If you are breastfeeding, limit how much and how often you drink alcohol.  Limit alcohol intake to no more than 1 drink per day for nonpregnant women. One drink equals 12 ounces of beer, 5 ounces of wine, or 1 ounces of hard liquor.  Do not use street drugs.  Do not share needles.  Ask your health care provider for help if you need support or information about quitting drugs.  Tell your health care provider if you often feel depressed.  Tell your health care provider if you have ever been abused or do not feel safe at home.   This information is not intended to replace advice given to you by your health care provider. Make sure you discuss any questions you have with your health care provider.   Document Released: 12/22/2010 Document Revised: 06/29/2014 Document Reviewed: 05/10/2013 Elsevier Interactive Patient Education 2016 [place] for Gastroesophageal Reflux Disease, Adult When you have gastroesophageal reflux disease (GERD), the foods you eat and your eating habits are very important. Choosing the right foods can help ease the discomfort of GERD. WHAT GENERAL GUIDELINES DO I NEED TO FOLLOW?  Choose fruits, vegetables, whole grains, low-fat dairy products, and low-fat meat, fish, and poultry.  Limit fats such as oils, salad dressings, butter, nuts, and avocado.  Keep a food diary to identify foods that cause  symptoms.  Avoid foods that cause reflux. These may be different for different people.  Eat frequent small meals instead of three large meals each day.  Eat your meals slowly, in a relaxed setting.  Limit fried foods.  Cook foods using methods other than frying.  Avoid drinking alcohol.  Avoid drinking large amounts of liquids with your meals.  Avoid bending over or lying down until 2-3 hours after eating. WHAT FOODS ARE NOT RECOMMENDED? The following are some foods and drinks that may worsen your symptoms: Vegetables Tomatoes. Tomato juice. Tomato and spaghetti sauce. Chili peppers. Onion and garlic. Horseradish. Fruits Oranges, grapefruit, and lemon (fruit and juice). Meats High-fat meats, fish, and poultry. This includes hot dogs, ribs, ham, sausage, salami, and bacon. Dairy Whole milk and chocolate milk. Sour cream. Cream. Butter. Ice cream. Cream cheese.  Beverages Coffee and tea, with or without caffeine. Carbonated beverages or energy drinks. Condiments Hot sauce. Barbecue sauce.  Sweets/Desserts Chocolate and cocoa. Donuts. Peppermint and spearmint. Fats and Oils High-fat foods, including [place] fries and potato chips. Other Vinegar. Strong spices, such as black pepper, white pepper, red pepper, cayenne, curry powder, cloves, ginger, and chili powder. The items listed above may not be a complete list of foods and beverages to avoid. Contact your dietitian for more information.   This information is not intended to replace advice given to you by your health care provider. Make sure you discuss any questions you have with your health care provider.   Document Released: 06/08/2005 Document Revised: 06/29/2014 Document Reviewed: 04/12/2013 Elsevier Interactive Patient Education 2016 [place] K. Panosh M.D.   Coronary Findings    Dominance: Right   Left Anterior Descending   . Prox LAD lesion, 20% stenosed.     Right Coronary Artery   .  Prox RCA lesion, 0% stenosed. Catheter induced spasm which normalized with IC NTG.

## 2015-04-10 LAB — HEPATITIS C ANTIBODY: HCV Ab: NEGATIVE

## 2015-10-14 DIAGNOSIS — H353211 Exudative age-related macular degeneration, right eye, with active choroidal neovascularization: Secondary | ICD-10-CM | POA: Diagnosis not present

## 2015-11-11 DIAGNOSIS — H353211 Exudative age-related macular degeneration, right eye, with active choroidal neovascularization: Secondary | ICD-10-CM | POA: Diagnosis not present

## 2015-11-11 DIAGNOSIS — H353122 Nonexudative age-related macular degeneration, left eye, intermediate dry stage: Secondary | ICD-10-CM | POA: Diagnosis not present

## 2015-12-20 DIAGNOSIS — H353211 Exudative age-related macular degeneration, right eye, with active choroidal neovascularization: Secondary | ICD-10-CM | POA: Diagnosis not present

## 2016-01-22 DIAGNOSIS — H353211 Exudative age-related macular degeneration, right eye, with active choroidal neovascularization: Secondary | ICD-10-CM | POA: Diagnosis not present

## 2016-03-10 DIAGNOSIS — H353211 Exudative age-related macular degeneration, right eye, with active choroidal neovascularization: Secondary | ICD-10-CM | POA: Diagnosis not present

## 2016-04-20 DIAGNOSIS — H353122 Nonexudative age-related macular degeneration, left eye, intermediate dry stage: Secondary | ICD-10-CM | POA: Diagnosis not present

## 2016-04-20 DIAGNOSIS — H353211 Exudative age-related macular degeneration, right eye, with active choroidal neovascularization: Secondary | ICD-10-CM | POA: Diagnosis not present

## 2016-04-29 DIAGNOSIS — B029 Zoster without complications: Secondary | ICD-10-CM | POA: Diagnosis not present

## 2016-05-25 DIAGNOSIS — H353211 Exudative age-related macular degeneration, right eye, with active choroidal neovascularization: Secondary | ICD-10-CM | POA: Diagnosis not present

## 2016-06-03 DIAGNOSIS — Z23 Encounter for immunization: Secondary | ICD-10-CM | POA: Diagnosis not present

## 2016-06-10 DIAGNOSIS — Z9882 Breast implant status: Secondary | ICD-10-CM | POA: Diagnosis not present

## 2016-06-10 DIAGNOSIS — K5901 Slow transit constipation: Secondary | ICD-10-CM | POA: Diagnosis not present

## 2016-06-10 DIAGNOSIS — B351 Tinea unguium: Secondary | ICD-10-CM | POA: Diagnosis not present

## 2016-06-20 DIAGNOSIS — H00025 Hordeolum internum left lower eyelid: Secondary | ICD-10-CM | POA: Diagnosis not present

## 2016-06-26 DIAGNOSIS — Z1239 Encounter for other screening for malignant neoplasm of breast: Secondary | ICD-10-CM | POA: Diagnosis not present

## 2016-06-26 DIAGNOSIS — N63 Unspecified lump in unspecified breast: Secondary | ICD-10-CM | POA: Diagnosis not present

## 2016-06-26 DIAGNOSIS — Z9882 Breast implant status: Secondary | ICD-10-CM | POA: Diagnosis not present

## 2016-06-26 DIAGNOSIS — Z1231 Encounter for screening mammogram for malignant neoplasm of breast: Secondary | ICD-10-CM | POA: Diagnosis not present

## 2016-06-26 DIAGNOSIS — N6320 Unspecified lump in the left breast, unspecified quadrant: Secondary | ICD-10-CM | POA: Diagnosis not present

## 2016-06-26 DIAGNOSIS — N6321 Unspecified lump in the left breast, upper outer quadrant: Secondary | ICD-10-CM | POA: Diagnosis not present

## 2016-06-30 DIAGNOSIS — H353211 Exudative age-related macular degeneration, right eye, with active choroidal neovascularization: Secondary | ICD-10-CM | POA: Diagnosis not present

## 2016-07-02 DIAGNOSIS — H524 Presbyopia: Secondary | ICD-10-CM | POA: Diagnosis not present

## 2016-07-02 DIAGNOSIS — H5203 Hypermetropia, bilateral: Secondary | ICD-10-CM | POA: Diagnosis not present

## 2016-07-07 DIAGNOSIS — N6459 Other signs and symptoms in breast: Secondary | ICD-10-CM | POA: Diagnosis not present

## 2016-07-07 DIAGNOSIS — R51 Headache: Secondary | ICD-10-CM | POA: Diagnosis not present

## 2016-07-07 DIAGNOSIS — E784 Other hyperlipidemia: Secondary | ICD-10-CM | POA: Diagnosis not present

## 2016-07-07 DIAGNOSIS — H3530 Unspecified macular degeneration: Secondary | ICD-10-CM | POA: Diagnosis not present

## 2016-07-22 DIAGNOSIS — G4733 Obstructive sleep apnea (adult) (pediatric): Secondary | ICD-10-CM | POA: Diagnosis not present

## 2016-08-03 DIAGNOSIS — H353122 Nonexudative age-related macular degeneration, left eye, intermediate dry stage: Secondary | ICD-10-CM | POA: Diagnosis not present

## 2016-08-03 DIAGNOSIS — H353211 Exudative age-related macular degeneration, right eye, with active choroidal neovascularization: Secondary | ICD-10-CM | POA: Diagnosis not present

## 2016-08-04 DIAGNOSIS — N6321 Unspecified lump in the left breast, upper outer quadrant: Secondary | ICD-10-CM | POA: Diagnosis not present

## 2016-08-25 DIAGNOSIS — N6459 Other signs and symptoms in breast: Secondary | ICD-10-CM | POA: Diagnosis not present

## 2016-09-07 DIAGNOSIS — H353211 Exudative age-related macular degeneration, right eye, with active choroidal neovascularization: Secondary | ICD-10-CM | POA: Diagnosis not present

## 2016-10-12 DIAGNOSIS — H353211 Exudative age-related macular degeneration, right eye, with active choroidal neovascularization: Secondary | ICD-10-CM | POA: Diagnosis not present

## 2016-11-23 DIAGNOSIS — H353211 Exudative age-related macular degeneration, right eye, with active choroidal neovascularization: Secondary | ICD-10-CM | POA: Diagnosis not present

## 2016-11-23 DIAGNOSIS — H353122 Nonexudative age-related macular degeneration, left eye, intermediate dry stage: Secondary | ICD-10-CM | POA: Diagnosis not present

## 2016-12-29 DIAGNOSIS — Z23 Encounter for immunization: Secondary | ICD-10-CM | POA: Diagnosis not present

## 2016-12-30 ENCOUNTER — Encounter: Payer: Self-pay | Admitting: Internal Medicine

## 2016-12-30 ENCOUNTER — Ambulatory Visit (INDEPENDENT_AMBULATORY_CARE_PROVIDER_SITE_OTHER): Payer: BLUE CROSS/BLUE SHIELD | Admitting: Internal Medicine

## 2016-12-30 VITALS — BP 110/64 | HR 62 | Temp 98.2°F | Wt 129.6 lb

## 2016-12-30 DIAGNOSIS — Z87440 Personal history of urinary (tract) infections: Secondary | ICD-10-CM | POA: Diagnosis not present

## 2016-12-30 DIAGNOSIS — R350 Frequency of micturition: Secondary | ICD-10-CM | POA: Diagnosis not present

## 2016-12-30 DIAGNOSIS — R300 Dysuria: Secondary | ICD-10-CM | POA: Diagnosis not present

## 2016-12-30 LAB — POC URINALSYSI DIPSTICK (AUTOMATED)
Bilirubin, UA: NEGATIVE
Glucose, UA: NEGATIVE
Ketones, UA: NEGATIVE
Leukocytes, UA: NEGATIVE
Nitrite, UA: NEGATIVE
PH UA: 6 (ref 5.0–8.0)
Protein, UA: NEGATIVE
RBC UA: NEGATIVE
SPEC GRAV UA: 1.02 (ref 1.010–1.025)
UROBILINOGEN UA: 0.2 U/dL

## 2016-12-30 MED ORDER — NITROFURANTOIN MONOHYD MACRO 100 MG PO CAPS: 100.0000 mg | ORAL_CAPSULE | Freq: Two times a day (BID) | ORAL | 0 refills | Status: AC

## 2016-12-30 NOTE — Patient Instructions (Signed)
Will do urine culture    To check for  Bacteria.  Can take antibiotic  If sx persist awaiting culture

## 2016-12-30 NOTE — Progress Notes (Signed)
Chief Complaint  Patient presents with  . Urinary Frequency    HPI: Paula Salas 61 y.o.  sda Comes in with one-week of burning urinary frequency without a lot of volume occasional left low back to the hip pain. No fever or chills. She does have a history of meatal stenosis and retention that was treated in [place] with dilatation. Was about 3-4 years ago. No fever and chills but no major change in bowel habits. She spends time in [place] and in [place]. Her mom passed away in the last year.  She had been using Azo and cranberry the past ROS: See pertinent positives and negatives per HPI.  Past Medical History:  Diagnosis Date  . History of retinal hemorrhage   . Hyperlipidemia   . Lower urinary tract symptoms (LUTS)    [place]?  Marland Kitchen. Macular degeneration   . Osteopenia    dexa 98 -1.4 and , -1.1, 99 -1.77 and -1.9, 2010 -1.9 -1.6 on bisphosphonate from 12  2003 to 4/07    Family History  Problem Relation Age of Onset  . Osteoporosis Other   . Colon cancer Other   . Dementia Mother     Social History   Social History  . Marital status: Single    Spouse name: N/A  . Number of children: N/A  . Years of education: N/A   Social History Main Topics  . Smoking status: Never Smoker  . Smokeless tobacco: Never Used  . Alcohol use No  . Drug use: Unknown  . Sexual activity: Not Asked   Other Topics Concern  . None   Social History Narrative   Single   [place] [facility] [place]    [facility]    [place] [facility]   Sept  coloumbia  mom  Dementia for a month.  [place] [facility]              Outpatient Medications Prior to Visit  Medication Sig Dispense Refill  . acyclovir (ZOVIRAX) 5 % ointment Apply 1 application topically as needed (for lips).     . Ascorbic Acid (VITAMIN C) 1000 MG tablet Take 1,000 mg by mouth daily.    . Cranberry 125 MG TABS Take 125 mg by mouth daily.     . Estradiol 10 MCG TABS vaginal  tablet Place 1 tablet (10 mcg total) vaginally as directed. 8 tablet 3  . mometasone (NASONEX) 50 MCG/ACT nasal spray Place 2 sprays into both nostrils daily as needed (for allergies).     . naproxen sodium (ANAPROX) 220 MG tablet Take 220-440 mg by mouth daily as needed (for pain).     Bertram Gala. Polyethyl Glycol-Propyl Glycol (SYSTANE PRESERVATIVE FREE OP) Place 1 drop into both eyes 2 (two) times daily as needed (for dry eyes).     . valACYclovir (VALTREX) 1000 MG tablet Take 1,000 mg by mouth 2 (two) times daily as needed (for lips).     . Calcium Carbonate-Vit D-Min (CALCIUM 1200 PO) Take 1,200 mg by mouth daily.     . ciprofloxacin (CIPRO) 500 MG tablet Take 1 tablet (500 mg total) by mouth 2 (two) times daily. If needed for uti 10 tablet 0  . fexofenadine-pseudoephedrine (ALLEGRA-D 24) 180-240 MG 24 hr tablet Take 1 tablet by mouth daily.    . nitroGLYCERIN (NITROSTAT) 0.4 MG SL tablet Place 1 tablet (0.4 mg total) under the tongue every 5 (five) minutes as  needed for chest pain. 25 tablet 6  . metoprolol tartrate (LOPRESSOR) 25 MG tablet Take 0.5 tablets (12.5 mg total) by mouth 2 (two) times daily. (Patient not taking: Reported on 12/30/2016) 180 tablet 3  . aspirin 81 MG tablet Take 81 mg by mouth daily.     No facility-administered medications prior to visit.      EXAM:  BP 110/64 (BP Location: Right Arm, Patient Position: Sitting, Cuff Size: Normal)   Pulse 62   Temp 98.2 F (36.8 C) (Oral)   Wt 129 lb 9.6 oz (58.8 kg)   BMI 24.09 kg/m   Body mass index is 24.09 kg/m.  GENERAL: vitals reviewed and listed above, alert, oriented, appears well hydrated and in no acute distress HEENT: atraumatic, conjunctiva  clear, no obvious abnormalities on inspection of external nose and ears Abdomen:  Sof,t normal bowel sounds without hepatosplenomegaly, no guarding rebound or masses no CVA tendernessMS: moves all extremities without noticeable focal  abnormality PSYCH: pleasant and cooperative, no  obvious depression or anxiety poct urine clear   ASSESSMENT AND PLAN:  Discussed the following assessment and plan:  Dysuria - Plan: POCT Urinalysis Dipstick (Automated), Urine Culture  Urinary frequency - Plan: POCT Urinalysis Dipstick (Automated), Urine Culture  History of UTI History of UTI add what sounds like urinary retention or bladder dysfunction more typical of her UTIs we'll do culture impaired treatment appropriate follow-up if persistent progressive.  -Patient advised to return or notify health care team  if symptoms worsen ,persist or new concerns arise.  Patient Instructions  Will do urine culture    To check for  Bacteria.  Can take antibiotic  If sx persist awaiting culture            Neta Mends. Kayon Dozier M.D.

## 2017-01-01 LAB — URINE CULTURE

## 2017-01-04 DIAGNOSIS — H353211 Exudative age-related macular degeneration, right eye, with active choroidal neovascularization: Secondary | ICD-10-CM | POA: Diagnosis not present

## 2017-01-04 DIAGNOSIS — H43811 Vitreous degeneration, right eye: Secondary | ICD-10-CM | POA: Diagnosis not present

## 2017-01-04 DIAGNOSIS — H3570 Unspecified separation of retinal layers: Secondary | ICD-10-CM | POA: Diagnosis not present

## 2017-01-04 DIAGNOSIS — H353122 Nonexudative age-related macular degeneration, left eye, intermediate dry stage: Secondary | ICD-10-CM | POA: Diagnosis not present

## 2017-01-11 DIAGNOSIS — H353211 Exudative age-related macular degeneration, right eye, with active choroidal neovascularization: Secondary | ICD-10-CM | POA: Diagnosis not present

## 2017-02-26 DIAGNOSIS — H353211 Exudative age-related macular degeneration, right eye, with active choroidal neovascularization: Secondary | ICD-10-CM | POA: Diagnosis not present

## 2017-02-26 DIAGNOSIS — H353122 Nonexudative age-related macular degeneration, left eye, intermediate dry stage: Secondary | ICD-10-CM | POA: Diagnosis not present

## 2017-03-11 ENCOUNTER — Telehealth: Payer: Self-pay | Admitting: Internal Medicine

## 2017-03-11 NOTE — Telephone Encounter (Signed)
Please advise Dr Fabian Sharp, All I see available between these dates are Well Child checks and Same Day appts. Thanks.

## 2017-03-11 NOTE — Telephone Encounter (Signed)
Pt is calling to see if Dr. Fabian Sharp could fit her in for a CPE in a early morning appointment due to her only being here October 4-15 and will be travelling other times.  Maybe using a wellchild check to do it.

## 2017-03-12 ENCOUNTER — Encounter: Payer: Self-pay | Admitting: Internal Medicine

## 2017-03-12 NOTE — Telephone Encounter (Signed)
Please schedule accordingly -- Dr Fabian Sharp is okay with using Well Child Checks/Same Day slots if needed to fit this time frame.

## 2017-03-12 NOTE — Telephone Encounter (Signed)
Ok to do this 

## 2017-03-15 NOTE — Telephone Encounter (Signed)
Pt has been scheduled.  °

## 2017-03-15 NOTE — Telephone Encounter (Signed)
lmom for pt to call back to schedule appt  

## 2017-03-30 ENCOUNTER — Encounter: Payer: BLUE CROSS/BLUE SHIELD | Admitting: Internal Medicine

## 2017-04-12 DIAGNOSIS — H353211 Exudative age-related macular degeneration, right eye, with active choroidal neovascularization: Secondary | ICD-10-CM | POA: Diagnosis not present

## 2017-04-13 DIAGNOSIS — Z8719 Personal history of other diseases of the digestive system: Secondary | ICD-10-CM | POA: Diagnosis not present

## 2017-04-13 DIAGNOSIS — M6080 Other myositis, unspecified site: Secondary | ICD-10-CM | POA: Diagnosis not present

## 2017-04-13 DIAGNOSIS — H3530 Unspecified macular degeneration: Secondary | ICD-10-CM | POA: Diagnosis not present

## 2017-04-13 DIAGNOSIS — F5101 Primary insomnia: Secondary | ICD-10-CM | POA: Diagnosis not present

## 2017-04-20 DIAGNOSIS — F320 Major depressive disorder, single episode, mild: Secondary | ICD-10-CM | POA: Diagnosis not present

## 2017-04-20 DIAGNOSIS — Z01419 Encounter for gynecological examination (general) (routine) without abnormal findings: Secondary | ICD-10-CM | POA: Diagnosis not present

## 2017-04-20 DIAGNOSIS — Z124 Encounter for screening for malignant neoplasm of cervix: Secondary | ICD-10-CM | POA: Diagnosis not present

## 2017-04-20 DIAGNOSIS — Z23 Encounter for immunization: Secondary | ICD-10-CM | POA: Diagnosis not present

## 2017-04-20 DIAGNOSIS — Z6824 Body mass index (BMI) 24.0-24.9, adult: Secondary | ICD-10-CM | POA: Diagnosis not present

## 2017-04-20 DIAGNOSIS — Z0000 Encounter for general adult medical examination without abnormal findings: Secondary | ICD-10-CM | POA: Diagnosis not present

## 2017-04-28 DIAGNOSIS — Z6824 Body mass index (BMI) 24.0-24.9, adult: Secondary | ICD-10-CM | POA: Diagnosis not present

## 2017-04-28 DIAGNOSIS — Z0000 Encounter for general adult medical examination without abnormal findings: Secondary | ICD-10-CM | POA: Diagnosis not present

## 2017-04-28 DIAGNOSIS — H3530 Unspecified macular degeneration: Secondary | ICD-10-CM | POA: Diagnosis not present

## 2017-04-28 DIAGNOSIS — F320 Major depressive disorder, single episode, mild: Secondary | ICD-10-CM | POA: Diagnosis not present

## 2017-05-24 DIAGNOSIS — H353211 Exudative age-related macular degeneration, right eye, with active choroidal neovascularization: Secondary | ICD-10-CM | POA: Diagnosis not present

## 2017-07-05 DIAGNOSIS — H353211 Exudative age-related macular degeneration, right eye, with active choroidal neovascularization: Secondary | ICD-10-CM | POA: Diagnosis not present

## 2017-08-16 DIAGNOSIS — H353211 Exudative age-related macular degeneration, right eye, with active choroidal neovascularization: Secondary | ICD-10-CM | POA: Diagnosis not present

## 2017-08-20 DIAGNOSIS — N3000 Acute cystitis without hematuria: Secondary | ICD-10-CM | POA: Diagnosis not present

## 2017-08-20 DIAGNOSIS — R300 Dysuria: Secondary | ICD-10-CM | POA: Diagnosis not present

## 2017-08-20 DIAGNOSIS — A048 Other specified bacterial intestinal infections: Secondary | ICD-10-CM | POA: Diagnosis not present

## 2017-09-27 DIAGNOSIS — H353211 Exudative age-related macular degeneration, right eye, with active choroidal neovascularization: Secondary | ICD-10-CM | POA: Diagnosis not present

## 2017-11-04 DIAGNOSIS — R300 Dysuria: Secondary | ICD-10-CM | POA: Diagnosis not present

## 2017-11-08 DIAGNOSIS — H353211 Exudative age-related macular degeneration, right eye, with active choroidal neovascularization: Secondary | ICD-10-CM | POA: Diagnosis not present

## 2017-12-20 DIAGNOSIS — H353211 Exudative age-related macular degeneration, right eye, with active choroidal neovascularization: Secondary | ICD-10-CM | POA: Diagnosis not present

## 2018-01-27 DIAGNOSIS — H35362 Drusen (degenerative) of macula, left eye: Secondary | ICD-10-CM | POA: Diagnosis not present

## 2018-01-27 DIAGNOSIS — H35372 Puckering of macula, left eye: Secondary | ICD-10-CM | POA: Diagnosis not present

## 2018-01-27 DIAGNOSIS — Z961 Presence of intraocular lens: Secondary | ICD-10-CM | POA: Diagnosis not present

## 2018-01-27 DIAGNOSIS — H25812 Combined forms of age-related cataract, left eye: Secondary | ICD-10-CM | POA: Diagnosis not present

## 2018-01-27 DIAGNOSIS — H353211 Exudative age-related macular degeneration, right eye, with active choroidal neovascularization: Secondary | ICD-10-CM | POA: Diagnosis not present

## 2018-03-10 DIAGNOSIS — H35363 Drusen (degenerative) of macula, bilateral: Secondary | ICD-10-CM | POA: Diagnosis not present

## 2018-03-10 DIAGNOSIS — H353122 Nonexudative age-related macular degeneration, left eye, intermediate dry stage: Secondary | ICD-10-CM | POA: Diagnosis not present

## 2018-03-10 DIAGNOSIS — H353211 Exudative age-related macular degeneration, right eye, with active choroidal neovascularization: Secondary | ICD-10-CM | POA: Diagnosis not present

## 2018-04-04 DIAGNOSIS — R350 Frequency of micturition: Secondary | ICD-10-CM | POA: Diagnosis not present

## 2018-04-04 DIAGNOSIS — K6289 Other specified diseases of anus and rectum: Secondary | ICD-10-CM | POA: Diagnosis not present

## 2018-04-11 DIAGNOSIS — H353211 Exudative age-related macular degeneration, right eye, with active choroidal neovascularization: Secondary | ICD-10-CM | POA: Diagnosis not present

## 2018-04-26 DIAGNOSIS — Z1211 Encounter for screening for malignant neoplasm of colon: Secondary | ICD-10-CM | POA: Diagnosis not present

## 2018-04-26 DIAGNOSIS — Z1212 Encounter for screening for malignant neoplasm of rectum: Secondary | ICD-10-CM | POA: Diagnosis not present

## 2018-04-29 DIAGNOSIS — E785 Hyperlipidemia, unspecified: Secondary | ICD-10-CM | POA: Diagnosis not present

## 2018-04-29 DIAGNOSIS — F320 Major depressive disorder, single episode, mild: Secondary | ICD-10-CM | POA: Diagnosis not present

## 2018-04-29 DIAGNOSIS — E7849 Other hyperlipidemia: Secondary | ICD-10-CM | POA: Diagnosis not present

## 2018-04-29 DIAGNOSIS — R300 Dysuria: Secondary | ICD-10-CM | POA: Diagnosis not present

## 2018-04-29 DIAGNOSIS — Z0000 Encounter for general adult medical examination without abnormal findings: Secondary | ICD-10-CM | POA: Diagnosis not present

## 2018-04-29 DIAGNOSIS — Z8719 Personal history of other diseases of the digestive system: Secondary | ICD-10-CM | POA: Diagnosis not present

## 2018-05-03 DIAGNOSIS — H353210 Exudative age-related macular degeneration, right eye, stage unspecified: Secondary | ICD-10-CM | POA: Diagnosis not present

## 2018-05-10 DIAGNOSIS — R928 Other abnormal and inconclusive findings on diagnostic imaging of breast: Secondary | ICD-10-CM | POA: Diagnosis not present

## 2018-05-10 DIAGNOSIS — Z9882 Breast implant status: Secondary | ICD-10-CM | POA: Diagnosis not present

## 2018-05-10 DIAGNOSIS — T8543XD Leakage of breast prosthesis and implant, subsequent encounter: Secondary | ICD-10-CM | POA: Diagnosis not present

## 2018-05-13 DIAGNOSIS — N63 Unspecified lump in unspecified breast: Secondary | ICD-10-CM | POA: Diagnosis not present

## 2018-05-13 DIAGNOSIS — R928 Other abnormal and inconclusive findings on diagnostic imaging of breast: Secondary | ICD-10-CM | POA: Diagnosis not present

## 2018-05-16 DIAGNOSIS — H353211 Exudative age-related macular degeneration, right eye, with active choroidal neovascularization: Secondary | ICD-10-CM | POA: Diagnosis not present

## 2018-05-17 DIAGNOSIS — R195 Other fecal abnormalities: Secondary | ICD-10-CM | POA: Diagnosis not present

## 2018-05-17 DIAGNOSIS — Z800 Family history of malignant neoplasm of digestive organs: Secondary | ICD-10-CM | POA: Diagnosis not present

## 2018-05-17 DIAGNOSIS — Z8619 Personal history of other infectious and parasitic diseases: Secondary | ICD-10-CM | POA: Diagnosis not present

## 2018-05-17 DIAGNOSIS — R140 Abdominal distension (gaseous): Secondary | ICD-10-CM | POA: Diagnosis not present

## 2018-05-24 DIAGNOSIS — R140 Abdominal distension (gaseous): Secondary | ICD-10-CM | POA: Diagnosis not present

## 2018-05-25 DIAGNOSIS — K635 Polyp of colon: Secondary | ICD-10-CM | POA: Diagnosis not present

## 2018-05-25 DIAGNOSIS — K2950 Unspecified chronic gastritis without bleeding: Secondary | ICD-10-CM | POA: Diagnosis not present

## 2018-05-25 DIAGNOSIS — K3189 Other diseases of stomach and duodenum: Secondary | ICD-10-CM | POA: Diagnosis not present

## 2018-05-25 DIAGNOSIS — K228 Other specified diseases of esophagus: Secondary | ICD-10-CM | POA: Diagnosis not present

## 2018-05-25 DIAGNOSIS — D126 Benign neoplasm of colon, unspecified: Secondary | ICD-10-CM | POA: Diagnosis not present

## 2018-05-25 DIAGNOSIS — K921 Melena: Secondary | ICD-10-CM | POA: Diagnosis not present

## 2018-05-25 DIAGNOSIS — K2970 Gastritis, unspecified, without bleeding: Secondary | ICD-10-CM | POA: Diagnosis not present

## 2018-05-25 DIAGNOSIS — K210 Gastro-esophageal reflux disease with esophagitis: Secondary | ICD-10-CM | POA: Diagnosis not present

## 2018-05-25 DIAGNOSIS — R195 Other fecal abnormalities: Secondary | ICD-10-CM | POA: Diagnosis not present

## 2018-05-25 DIAGNOSIS — D122 Benign neoplasm of ascending colon: Secondary | ICD-10-CM | POA: Diagnosis not present

## 2018-05-25 DIAGNOSIS — K6389 Other specified diseases of intestine: Secondary | ICD-10-CM | POA: Diagnosis not present

## 2018-05-25 DIAGNOSIS — K219 Gastro-esophageal reflux disease without esophagitis: Secondary | ICD-10-CM | POA: Diagnosis not present

## 2018-05-25 DIAGNOSIS — K641 Second degree hemorrhoids: Secondary | ICD-10-CM | POA: Diagnosis not present

## 2018-06-01 DIAGNOSIS — Z6824 Body mass index (BMI) 24.0-24.9, adult: Secondary | ICD-10-CM | POA: Diagnosis not present

## 2018-06-01 DIAGNOSIS — K209 Esophagitis, unspecified: Secondary | ICD-10-CM | POA: Diagnosis not present

## 2018-06-01 DIAGNOSIS — Z124 Encounter for screening for malignant neoplasm of cervix: Secondary | ICD-10-CM | POA: Diagnosis not present

## 2018-06-01 DIAGNOSIS — Z01419 Encounter for gynecological examination (general) (routine) without abnormal findings: Secondary | ICD-10-CM | POA: Diagnosis not present

## 2018-07-04 DIAGNOSIS — H353211 Exudative age-related macular degeneration, right eye, with active choroidal neovascularization: Secondary | ICD-10-CM | POA: Diagnosis not present

## 2018-08-15 DIAGNOSIS — H353211 Exudative age-related macular degeneration, right eye, with active choroidal neovascularization: Secondary | ICD-10-CM | POA: Diagnosis not present

## 2018-10-10 DIAGNOSIS — H353211 Exudative age-related macular degeneration, right eye, with active choroidal neovascularization: Secondary | ICD-10-CM | POA: Diagnosis not present

## 2018-10-31 DIAGNOSIS — Z20828 Contact with and (suspected) exposure to other viral communicable diseases: Secondary | ICD-10-CM | POA: Diagnosis not present

## 2018-10-31 DIAGNOSIS — Z03818 Encounter for observation for suspected exposure to other biological agents ruled out: Secondary | ICD-10-CM | POA: Diagnosis not present

## 2018-11-28 DIAGNOSIS — H353211 Exudative age-related macular degeneration, right eye, with active choroidal neovascularization: Secondary | ICD-10-CM | POA: Diagnosis not present

## 2018-12-07 DIAGNOSIS — E7849 Other hyperlipidemia: Secondary | ICD-10-CM | POA: Diagnosis not present

## 2018-12-07 DIAGNOSIS — R300 Dysuria: Secondary | ICD-10-CM | POA: Diagnosis not present

## 2018-12-07 DIAGNOSIS — E785 Hyperlipidemia, unspecified: Secondary | ICD-10-CM | POA: Diagnosis not present

## 2018-12-07 DIAGNOSIS — K209 Esophagitis, unspecified: Secondary | ICD-10-CM | POA: Diagnosis not present

## 2018-12-07 DIAGNOSIS — Z0000 Encounter for general adult medical examination without abnormal findings: Secondary | ICD-10-CM | POA: Diagnosis not present

## 2018-12-07 DIAGNOSIS — F320 Major depressive disorder, single episode, mild: Secondary | ICD-10-CM | POA: Diagnosis not present

## 2018-12-07 DIAGNOSIS — Z6825 Body mass index (BMI) 25.0-25.9, adult: Secondary | ICD-10-CM | POA: Diagnosis not present

## 2022-04-08 ENCOUNTER — Telehealth: Payer: Self-pay | Admitting: Podiatry

## 2022-04-08 ENCOUNTER — Ambulatory Visit (INDEPENDENT_AMBULATORY_CARE_PROVIDER_SITE_OTHER): Payer: Medicare Other

## 2022-04-08 ENCOUNTER — Ambulatory Visit (INDEPENDENT_AMBULATORY_CARE_PROVIDER_SITE_OTHER): Payer: Medicare Other | Admitting: Podiatry

## 2022-04-08 DIAGNOSIS — M7752 Other enthesopathy of left foot: Secondary | ICD-10-CM

## 2022-04-08 MED ORDER — BETAMETHASONE SOD PHOS & ACET 6 (3-3) MG/ML IJ SUSP
3.0000 mg | Freq: Once | INTRAMUSCULAR | Status: AC
  Administered 2022-04-08: 3 mg via INTRA_ARTICULAR

## 2022-04-08 MED ORDER — MELOXICAM 15 MG PO TABS: 15.0000 mg | ORAL_TABLET | Freq: Every day | ORAL | 1 refills | Status: AC

## 2022-04-08 NOTE — Progress Notes (Signed)
   Chief Complaint  Patient presents with   Bunions    Patient is here for left foot bunion pain.    Subjective: 66 y.o. female presents today as a new patient for evaluation of pain and tenderness localized around the great toe joint of the left foot.  Patient is concerned for bunion deformity.  She has pain and tenderness on a daily basis.  It has been painful for the last few years now.  She denies a history of injury.  She has not done anything recently for treatment  Past Medical History:  Diagnosis Date   History of retinal hemorrhage    Hyperlipidemia    Lower urinary tract symptoms (LUTS)    had uro evaluation  in [place] PVR?   Macular degeneration    Osteopenia    dexa 98 -1.4 and , -1.1, 99 -1.77 and -1.9, 2010 -1.9 -1.6 on bisphosphonate from 12  2003 to 4/07    Past Surgical History:  Procedure Laterality Date   BREAST ENHANCEMENT SURGERY     CARDIAC CATHETERIZATION N/A 04/05/2015   Procedure: Left Heart Cath and Coronary Angiography;  Surgeon: Troy Sine, MD;  Location: [facility];  Service: Cardiovascular;  Laterality: N/A;   MRI hip  06/04   neg    Allergies  Allergen Reactions   Clindamycin/Lincomycin Hives   Oysters [Shellfish Allergy] Swelling and Rash     Objective: Physical Exam General: The patient is alert and oriented x3 in no acute distress.  Dermatology: Skin is cool, dry and supple bilateral lower extremities. Negative for open lesions or macerations.  Vascular: Palpable pedal pulses bilaterally. No edema or erythema noted. Capillary refill within normal limits.  Neurological: Epicritic and protective threshold grossly intact bilaterally.   Musculoskeletal Exam: Clinical evidence of bunion deformity noted to the respective foot. There is moderate pain on palpation range of motion of the first MPJ.  Limited range of motion as well consistent with hallux limitus.  Lateral deviation of the hallux noted consistent with hallux  abductovalgus.  Radiographic Exam: Normal osseous mineralization.  No acute fractures identified.  Increased intermetatarsal angle greater than 15 with a hallux abductus angle greater than 30 noted on AP view.  Degenerative changes of the first MTP joint noted  Assessment: 1.  Hallux valgus left 2.  First MTP capsulitis left   Plan of Care:  1. Patient was evaluated. X-Rays reviewed. 2.  Injection of 0.5 cc Celestone Soluspan injected in the first MTP left foot 3.  We did discuss arthrodesis surgery.  The patient does not live here in [place] and lives in [place], [place].  Recommend that she follows up with podiatry in [place] 4.  Recommend good supportive shoes and advised against going barefoot 5 return to clinic as needed   Edrick Kins, DPM [facility]  Dr. Edrick Kins, DPM    [address], [place] [address]                Office [phone]  Fax [phone]

## 2022-04-08 NOTE — Addendum Note (Signed)
Addended by: Edrick Kins on: 04/08/2022 05:36 PM   Modules accepted: Orders

## 2022-04-08 NOTE — Telephone Encounter (Signed)
Pt states she was told that meloxicam would be sent to pharmacy and she went there but RX is not on file.   [id number] - [place], [address]  Please advise
